# Patient Record
Sex: Female | Born: 1985 | Race: Black or African American | Hispanic: No | Marital: Single | Smoking: Current every day smoker
Health system: Southern US, Community
[De-identification: ages and names within clinical notes are randomized; demographics above are authoritative.]

## PROBLEM LIST (undated history)

## (undated) DIAGNOSIS — Z8659 Personal history of other mental and behavioral disorders: Secondary | ICD-10-CM

## (undated) DIAGNOSIS — Z87898 Personal history of other specified conditions: Secondary | ICD-10-CM

## (undated) DIAGNOSIS — R569 Unspecified convulsions: Secondary | ICD-10-CM

## (undated) HISTORY — DX: Personal history of other specified conditions: Z87.898

## (undated) HISTORY — DX: Personal history of other mental and behavioral disorders: Z86.59

---

## 2004-07-04 ENCOUNTER — Emergency Department: Payer: Self-pay | Admitting: Emergency Medicine

## 2005-07-25 ENCOUNTER — Emergency Department: Payer: Self-pay | Admitting: General Practice

## 2005-07-25 ENCOUNTER — Other Ambulatory Visit: Payer: Self-pay

## 2005-10-09 ENCOUNTER — Emergency Department: Payer: Self-pay | Admitting: Emergency Medicine

## 2007-02-22 ENCOUNTER — Emergency Department: Payer: Self-pay | Admitting: Emergency Medicine

## 2007-05-07 ENCOUNTER — Emergency Department: Payer: Self-pay | Admitting: Emergency Medicine

## 2007-06-01 ENCOUNTER — Emergency Department: Payer: Self-pay | Admitting: Emergency Medicine

## 2007-07-21 ENCOUNTER — Encounter: Payer: Self-pay | Admitting: Maternal & Fetal Medicine

## 2007-10-04 ENCOUNTER — Observation Stay: Payer: Self-pay

## 2007-12-19 ENCOUNTER — Observation Stay: Payer: Self-pay | Admitting: Obstetrics & Gynecology

## 2007-12-22 ENCOUNTER — Inpatient Hospital Stay: Payer: Self-pay

## 2008-03-09 ENCOUNTER — Emergency Department: Payer: Self-pay | Admitting: Emergency Medicine

## 2008-08-01 ENCOUNTER — Emergency Department: Payer: Self-pay | Admitting: Internal Medicine

## 2009-01-01 ENCOUNTER — Emergency Department: Payer: Self-pay | Admitting: Unknown Physician Specialty

## 2009-06-03 ENCOUNTER — Emergency Department: Payer: Self-pay | Admitting: Internal Medicine

## 2010-07-03 ENCOUNTER — Emergency Department: Payer: Self-pay | Admitting: Internal Medicine

## 2010-07-07 ENCOUNTER — Emergency Department: Payer: Self-pay | Admitting: Emergency Medicine

## 2010-11-03 ENCOUNTER — Emergency Department: Payer: Self-pay | Admitting: Unknown Physician Specialty

## 2011-02-17 ENCOUNTER — Emergency Department: Payer: Self-pay | Admitting: *Deleted

## 2011-06-17 ENCOUNTER — Emergency Department: Payer: Self-pay | Admitting: *Deleted

## 2011-08-09 ENCOUNTER — Emergency Department: Payer: Self-pay | Admitting: *Deleted

## 2011-08-09 LAB — TROPONIN I: Troponin-I: <0.02 ng/mL

## 2011-08-09 LAB — COMPREHENSIVE METABOLIC PANEL
Anion Gap: 11 (ref 7–16)
BUN: 8 mg/dL (ref 7–18)
Calcium, Total: 9.2 mg/dL (ref 8.5–10.1)
Chloride: 103 mmol/L (ref 98–107)
Co2: 27 mmol/L (ref 21–32)
Creatinine: 0.89 mg/dL (ref 0.60–1.30)
EGFR (African American): >60
Glucose: 86 mg/dL (ref 65–99)
Osmolality: 279 (ref 275–301)
Potassium: 3.8 mmol/L (ref 3.5–5.1)
SGOT(AST): 21 U/L (ref 15–37)
Sodium: 141 mmol/L (ref 136–145)

## 2011-08-09 LAB — CBC
HGB: 13.3 g/dL (ref 12.0–16.0)
MCHC: 32.7 g/dL (ref 32.0–36.0)
Platelet: 323 10*3/uL (ref 150–440)
RBC: 4.26 10*6/uL (ref 3.80–5.20)
RDW: 13.2 % (ref 11.5–14.5)

## 2011-08-11 ENCOUNTER — Emergency Department: Payer: Self-pay | Admitting: *Deleted

## 2011-08-11 LAB — COMPREHENSIVE METABOLIC PANEL
Alkaline Phosphatase: 25 U/L — ABNORMAL LOW (ref 50–136)
Anion Gap: 10 (ref 7–16)
BUN: 13 mg/dL (ref 7–18)
Bilirubin,Total: 0.5 mg/dL (ref 0.2–1.0)
Calcium, Total: 8.8 mg/dL (ref 8.5–10.1)
Chloride: 103 mmol/L (ref 98–107)
EGFR (Non-African Amer.): >60
Glucose: 92 mg/dL (ref 65–99)
Osmolality: 279 (ref 275–301)
Potassium: 4 mmol/L (ref 3.5–5.1)
SGOT(AST): 18 U/L (ref 15–37)

## 2011-08-11 LAB — CBC
HCT: 40.5 % (ref 35.0–47.0)
HGB: 13.1 g/dL (ref 12.0–16.0)
MCH: 30.7 pg (ref 26.0–34.0)
MCHC: 32.3 g/dL (ref 32.0–36.0)

## 2011-08-11 LAB — URINALYSIS, COMPLETE
Bacteria: NONE SEEN
Bilirubin,UR: NEGATIVE
Glucose,UR: NEGATIVE mg/dL (ref 0–75)
Ketone: NEGATIVE
Leukocyte Esterase: NEGATIVE
Nitrite: NEGATIVE
Protein: NEGATIVE
RBC,UR: 1 /HPF (ref 0–5)
Squamous Epithelial: 1
WBC UR: 1 /HPF (ref 0–5)

## 2011-09-12 ENCOUNTER — Emergency Department: Payer: Self-pay | Admitting: Emergency Medicine

## 2011-09-12 LAB — CBC
HCT: 41.3 % (ref 35.0–47.0)
HGB: 13.1 g/dL (ref 12.0–16.0)
MCH: 30 pg (ref 26.0–34.0)
MCHC: 31.7 g/dL — ABNORMAL LOW (ref 32.0–36.0)
MCV: 95 fL (ref 80–100)
Platelet: 326 10*3/uL (ref 150–440)
RBC: 4.37 10*6/uL (ref 3.80–5.20)
WBC: 18.3 10*3/uL — ABNORMAL HIGH (ref 3.6–11.0)

## 2011-09-12 LAB — DRUG SCREEN, URINE
Amphetamines, Ur Screen: NEGATIVE (ref ?–1000)
Barbiturates, Ur Screen: NEGATIVE (ref ?–200)
Benzodiazepine, Ur Scrn: NEGATIVE (ref ?–200)
Cannabinoid 50 Ng, Ur ~~LOC~~: NEGATIVE (ref ?–50)
Cocaine Metabolite,Ur ~~LOC~~: POSITIVE (ref ?–300)
MDMA (Ecstasy)Ur Screen: POSITIVE (ref ?–500)
Methadone, Ur Screen: NEGATIVE (ref ?–300)
Phencyclidine (PCP) Ur S: NEGATIVE (ref ?–25)
Tricyclic, Ur Screen: NEGATIVE (ref ?–1000)

## 2011-09-12 LAB — COMPREHENSIVE METABOLIC PANEL
Anion Gap: 17 — ABNORMAL HIGH (ref 7–16)
Bilirubin,Total: 0.7 mg/dL (ref 0.2–1.0)
Calcium, Total: 9.2 mg/dL (ref 8.5–10.1)
Chloride: 103 mmol/L (ref 98–107)
Creatinine: 0.84 mg/dL (ref 0.60–1.30)
EGFR (African American): >60
EGFR (Non-African Amer.): >60
Osmolality: 276 (ref 275–301)
SGOT(AST): 40 U/L — ABNORMAL HIGH (ref 15–37)
SGPT (ALT): 29 U/L
Sodium: 140 mmol/L (ref 136–145)
Total Protein: 8.4 g/dL — ABNORMAL HIGH (ref 6.4–8.2)

## 2011-09-12 LAB — URINALYSIS, COMPLETE
Bilirubin,UR: NEGATIVE
Blood: NEGATIVE
Glucose,UR: 50 mg/dL (ref 0–75)
Leukocyte Esterase: NEGATIVE
Nitrite: NEGATIVE
Ph: 5 (ref 4.5–8.0)
Protein: NEGATIVE
RBC,UR: <1 /HPF (ref 0–5)
Squamous Epithelial: <1
WBC UR: <1 /HPF (ref 0–5)

## 2011-09-12 LAB — ETHANOL: Ethanol: 97 mg/dL

## 2011-09-12 LAB — PREGNANCY, URINE: Pregnancy Test, Urine: NEGATIVE m[IU]/mL

## 2011-10-11 ENCOUNTER — Emergency Department: Payer: Self-pay | Admitting: Internal Medicine

## 2012-01-16 ENCOUNTER — Emergency Department: Payer: Self-pay | Admitting: *Deleted

## 2012-04-11 IMAGING — CR DG THORACIC SPINE 2-3V
1 series · 3 of 3 positions shown · non-contrast
Comparison: none

REASON FOR EXAM: back pain
COMMENTS:

PROCEDURE:     DXR - DXR THORACIC  AP AND LATERAL  - July 03, 2010 [DATE]
RESULT:     The vertebral body heights and the intervertebral disc spaces
are well maintained. The vertebral body alignment is normal. No lytic or
blastic lesions are seen. The pedicles are bilaterally intact.

[Series 1: view not recorded · 0.17mm/px · 3 of 3 slices shown]
[im 1/3]
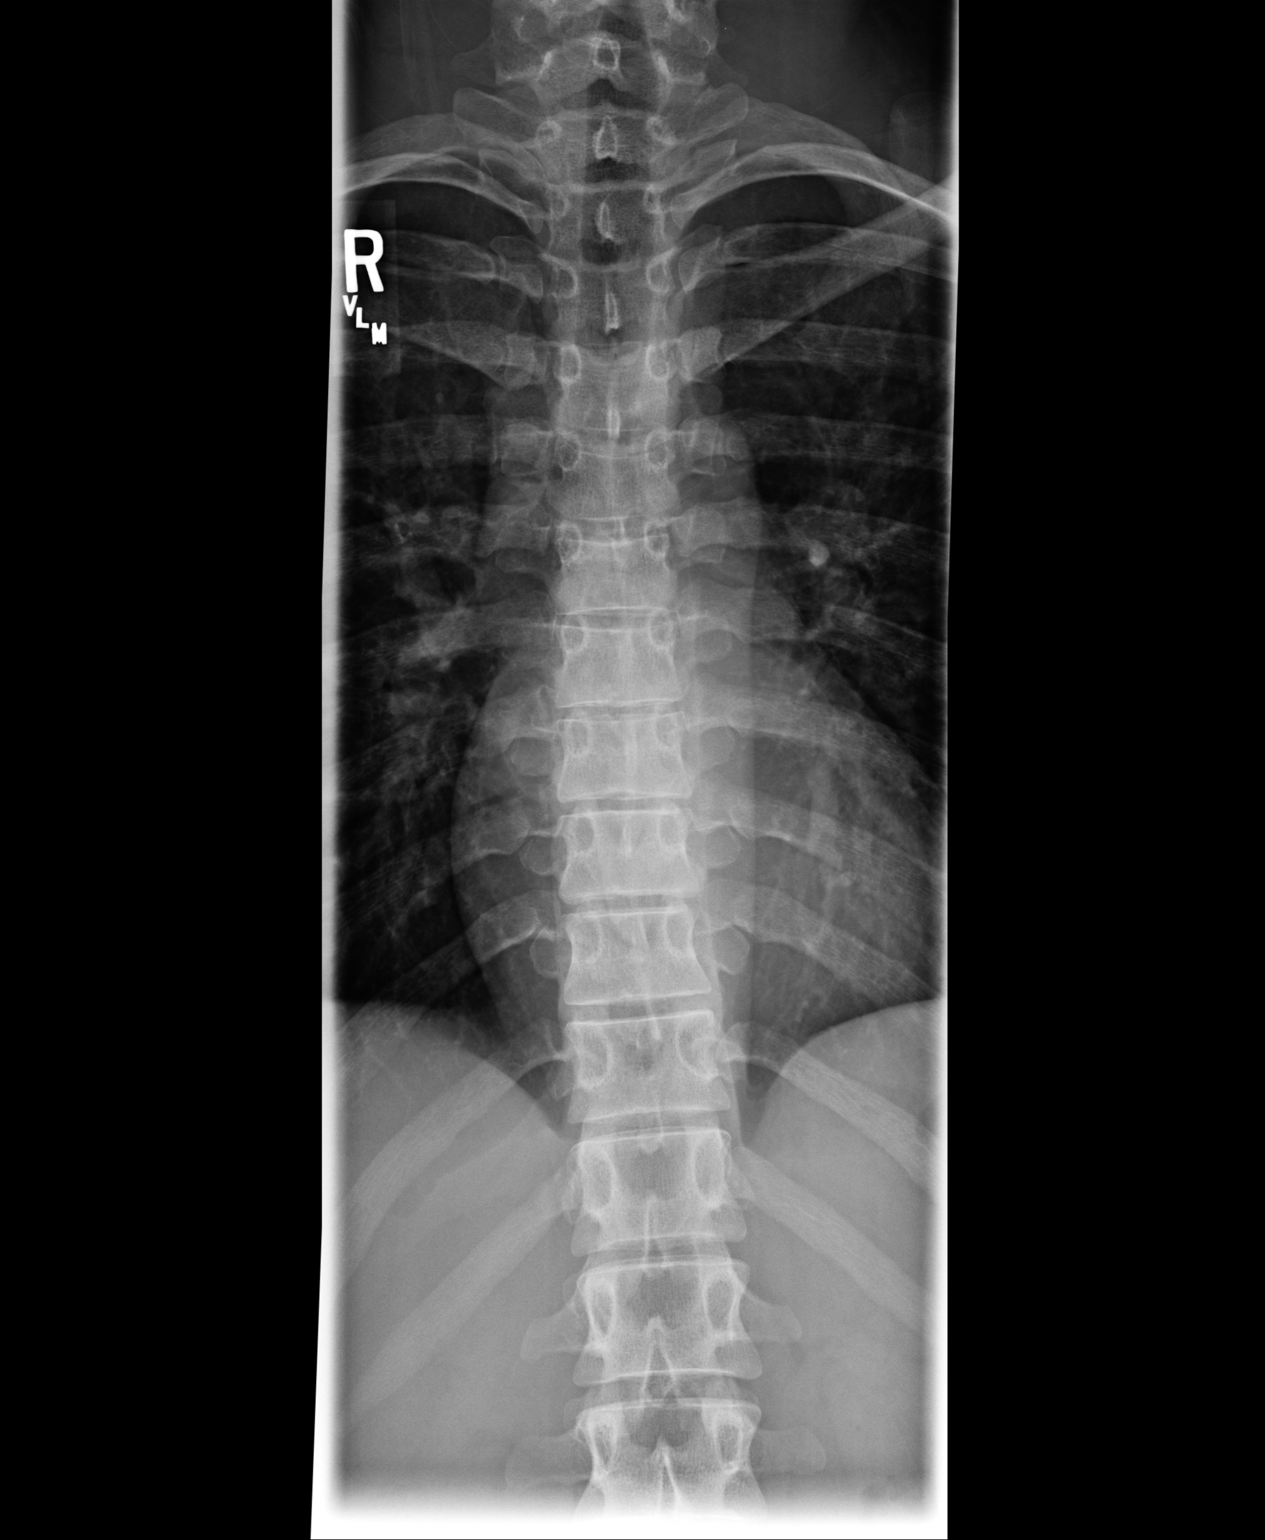
[im 2/3]
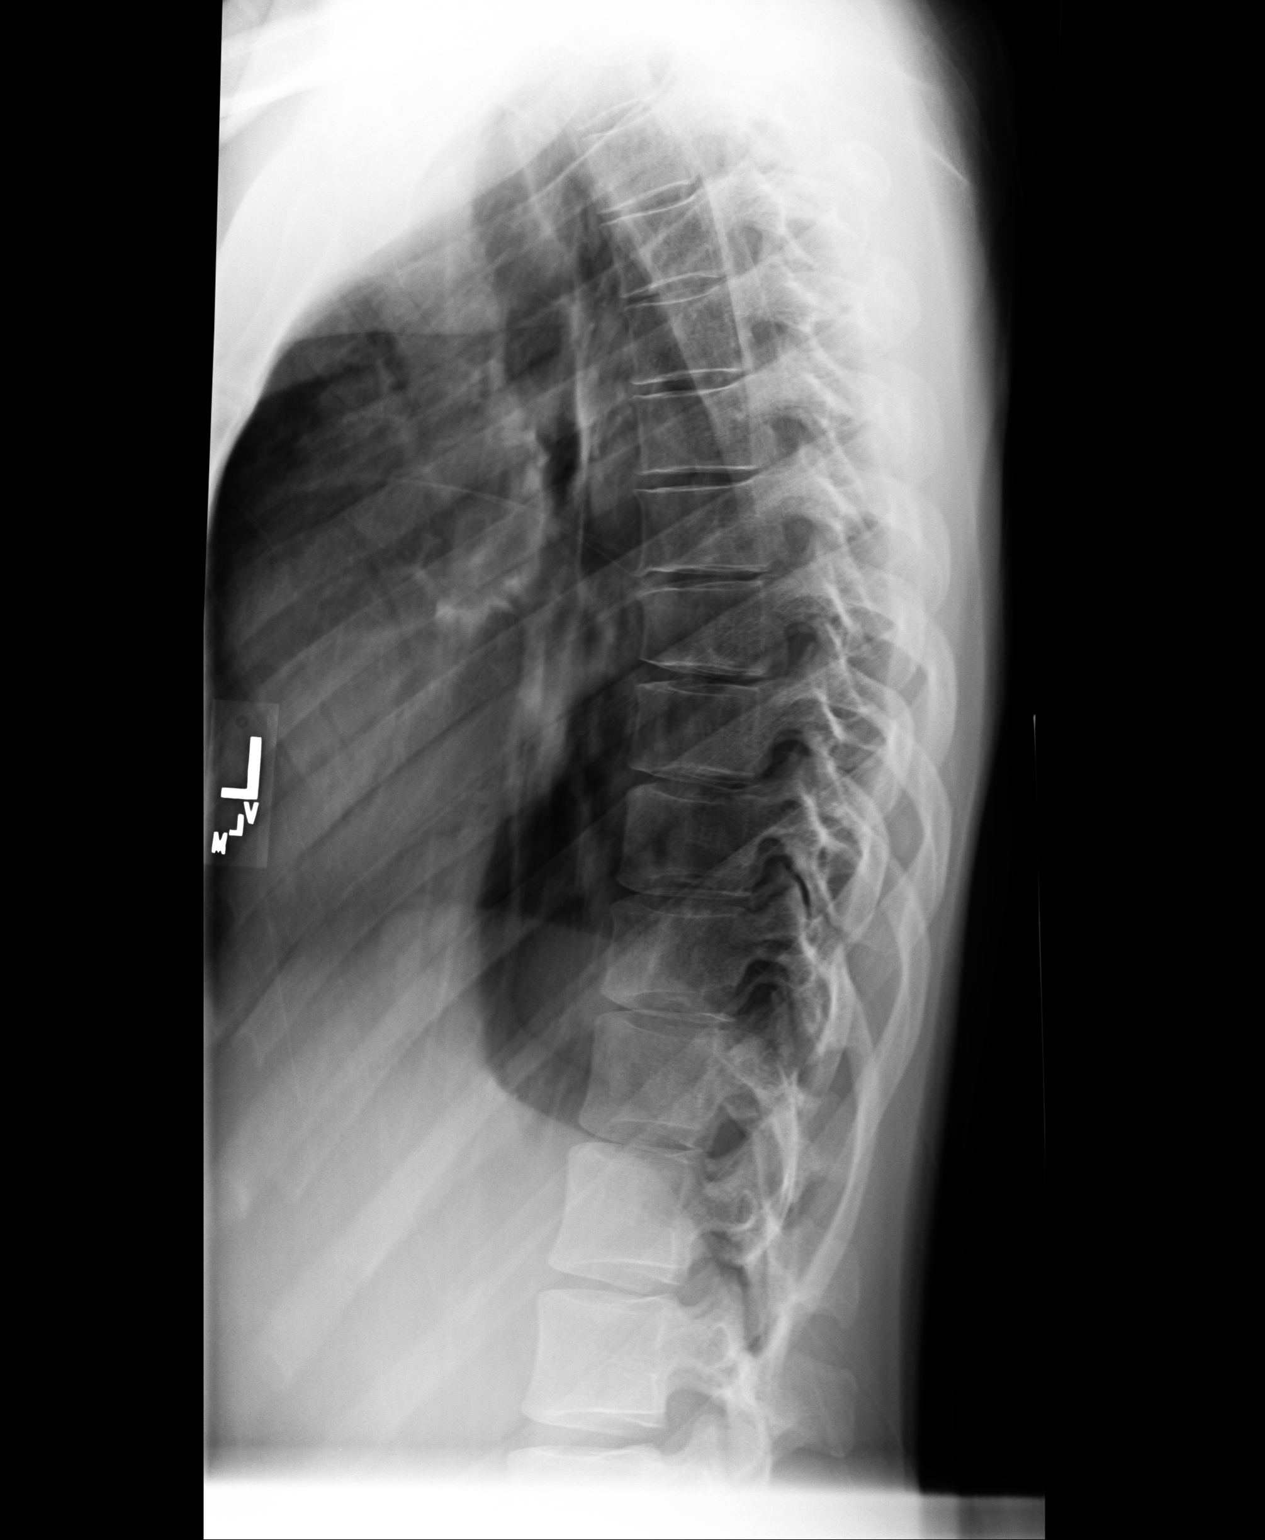
[im 3/3]
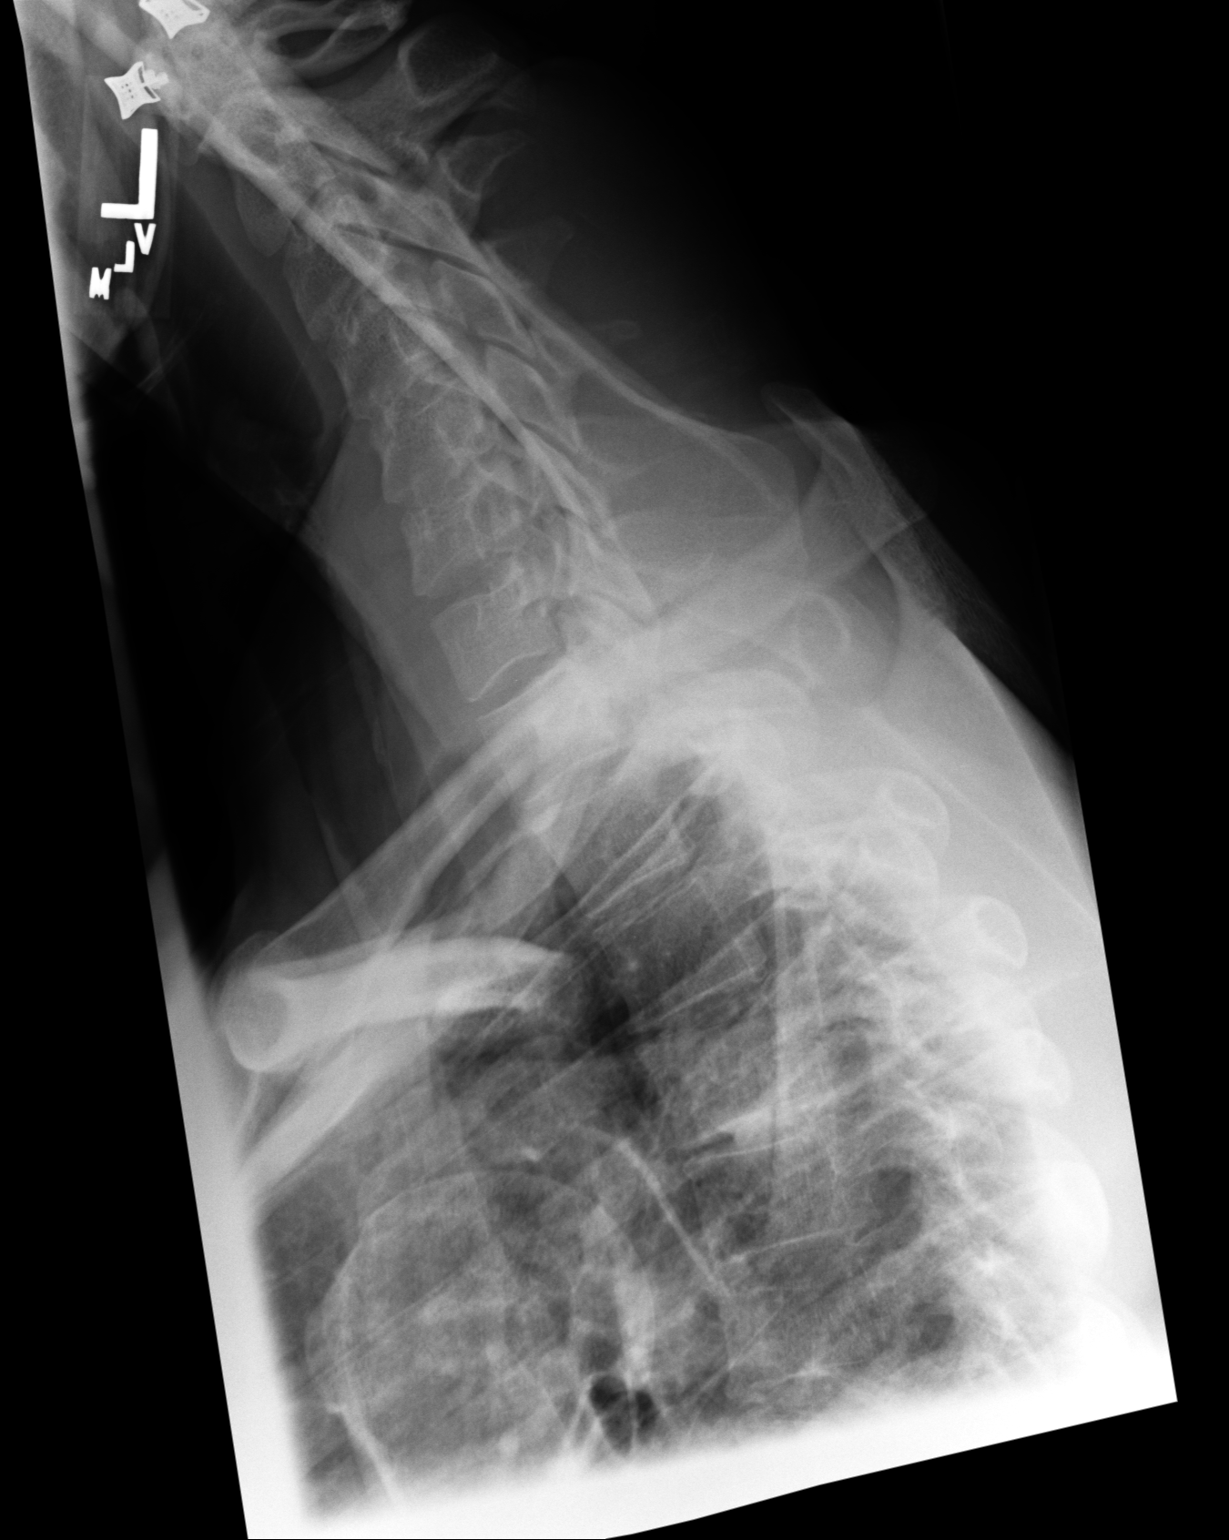

[3 of 3 positions shown; findings below may reference images not displayed]

IMPRESSION: 1.     No significant abnormalities are noted.

## 2012-07-18 ENCOUNTER — Emergency Department: Payer: Self-pay | Admitting: Emergency Medicine

## 2012-08-31 ENCOUNTER — Emergency Department: Payer: Self-pay | Admitting: Emergency Medicine

## 2012-09-15 ENCOUNTER — Emergency Department: Payer: Self-pay | Admitting: Emergency Medicine

## 2013-04-10 ENCOUNTER — Emergency Department: Payer: Self-pay | Admitting: Emergency Medicine

## 2013-04-15 ENCOUNTER — Emergency Department: Payer: Self-pay | Admitting: Emergency Medicine

## 2013-07-08 ENCOUNTER — Emergency Department: Payer: Self-pay | Admitting: Emergency Medicine

## 2013-07-08 LAB — CBC
HCT: 37.2 % (ref 35.0–47.0)
HGB: 12.4 g/dL (ref 12.0–16.0)
MCH: 31.1 pg (ref 26.0–34.0)
MCHC: 33.3 g/dL (ref 32.0–36.0)
MCV: 93 fL (ref 80–100)
Platelet: 259 10*3/uL (ref 150–440)
RBC: 3.99 10*6/uL (ref 3.80–5.20)
RDW: 13.5 % (ref 11.5–14.5)
WBC: 7.5 10*3/uL (ref 3.6–11.0)

## 2013-07-08 LAB — BASIC METABOLIC PANEL
Anion Gap: 4 — ABNORMAL LOW (ref 7–16)
BUN: 12 mg/dL (ref 7–18)
CHLORIDE: 107 mmol/L (ref 98–107)
Calcium, Total: 8.8 mg/dL (ref 8.5–10.1)
Co2: 26 mmol/L (ref 21–32)
Creatinine: 0.66 mg/dL (ref 0.60–1.30)
EGFR (African American): >60
EGFR (Non-African Amer.): >60
Glucose: 84 mg/dL (ref 65–99)
Osmolality: 273 (ref 275–301)
POTASSIUM: 4.1 mmol/L (ref 3.5–5.1)
Sodium: 137 mmol/L (ref 136–145)

## 2014-03-10 ENCOUNTER — Emergency Department: Payer: Self-pay | Admitting: Emergency Medicine

## 2014-06-22 ENCOUNTER — Emergency Department: Payer: Self-pay | Admitting: Emergency Medicine

## 2014-08-09 ENCOUNTER — Emergency Department: Payer: Self-pay | Admitting: Emergency Medicine

## 2014-12-27 ENCOUNTER — Encounter: Payer: Self-pay | Admitting: Emergency Medicine

## 2014-12-27 DIAGNOSIS — Z3202 Encounter for pregnancy test, result negative: Secondary | ICD-10-CM | POA: Insufficient documentation

## 2014-12-27 DIAGNOSIS — D252 Subserosal leiomyoma of uterus: Secondary | ICD-10-CM | POA: Insufficient documentation

## 2014-12-27 LAB — POCT PREGNANCY, URINE: Preg Test, Ur: NEGATIVE

## 2014-12-27 NOTE — ED Notes (Signed)
Pt presents to ER alert and in NAD. Pt states RLQ pain for one month. Pt denies n/v/d. Pt laughing and joking with friend.

## 2014-12-28 ENCOUNTER — Emergency Department
Admission: EM | Admit: 2014-12-28 | Discharge: 2014-12-28 | Disposition: A | Discharge status: Home / Self Care | Payer: Self-pay | Attending: Emergency Medicine | Admitting: Emergency Medicine

## 2014-12-28 ENCOUNTER — Emergency Department: Payer: Self-pay

## 2014-12-28 DIAGNOSIS — R102 Pelvic and perineal pain unspecified side: Secondary | ICD-10-CM

## 2014-12-28 DIAGNOSIS — D252 Subserosal leiomyoma of uterus: Secondary | ICD-10-CM

## 2014-12-28 LAB — COMPREHENSIVE METABOLIC PANEL
ALK PHOS: 32 U/L — AB (ref 38–126)
ALT: 12 U/L — ABNORMAL LOW (ref 14–54)
AST: 19 U/L (ref 15–41)
Albumin: 4.1 g/dL (ref 3.5–5.0)
Anion gap: 6 (ref 5–15)
BUN: 15 mg/dL (ref 6–20)
CALCIUM: 9 mg/dL (ref 8.9–10.3)
CHLORIDE: 106 mmol/L (ref 101–111)
CO2: 27 mmol/L (ref 22–32)
Creatinine, Ser: 0.71 mg/dL (ref 0.44–1.00)
GFR calc Af Amer: >60 mL/min (ref >60)
GFR calc non Af Amer: >60 mL/min (ref >60)
GLUCOSE: 92 mg/dL (ref 65–99)
POTASSIUM: 3.8 mmol/L (ref 3.5–5.1)
SODIUM: 139 mmol/L (ref 135–145)
TOTAL PROTEIN: 6.9 g/dL (ref 6.5–8.1)
Total Bilirubin: 0.4 mg/dL (ref 0.3–1.2)

## 2014-12-28 LAB — CBC WITH DIFFERENTIAL/PLATELET
BASOS ABS: 0.1 10*3/uL (ref 0–0.1)
BASOS PCT: 1 %
Eosinophils Absolute: 0.3 10*3/uL (ref 0–0.7)
Eosinophils Relative: 3 %
HEMATOCRIT: 36.6 % (ref 35.0–47.0)
Hemoglobin: 12.2 g/dL (ref 12.0–16.0)
LYMPHS PCT: 41 %
Lymphs Abs: 3.6 10*3/uL (ref 1.0–3.6)
MCH: 30.3 pg (ref 26.0–34.0)
MCHC: 33.2 g/dL (ref 32.0–36.0)
MCV: 91.4 fL (ref 80.0–100.0)
Monocytes Absolute: 0.6 10*3/uL (ref 0.2–0.9)
Monocytes Relative: 7 %
NEUTROS PCT: 48 %
Neutro Abs: 4.3 10*3/uL (ref 1.4–6.5)
PLATELETS: 267 10*3/uL (ref 150–440)
RBC: 4.01 MIL/uL (ref 3.80–5.20)
RDW: 14.7 % — ABNORMAL HIGH (ref 11.5–14.5)
WBC: 8.8 10*3/uL (ref 3.6–11.0)

## 2014-12-28 LAB — URINALYSIS COMPLETE WITH MICROSCOPIC (ARMC ONLY)
BACTERIA UA: NONE SEEN
BILIRUBIN URINE: NEGATIVE
Glucose, UA: NEGATIVE mg/dL
Hgb urine dipstick: NEGATIVE
KETONES UR: NEGATIVE mg/dL
Leukocytes, UA: NEGATIVE
Nitrite: NEGATIVE
PH: 7 (ref 5.0–8.0)
PROTEIN: NEGATIVE mg/dL
SPECIFIC GRAVITY, URINE: 1.021 (ref 1.005–1.030)

## 2014-12-28 LAB — LIPASE, BLOOD: Lipase: 43 U/L (ref 22–51)

## 2014-12-28 MED ORDER — KETOROLAC TROMETHAMINE 10 MG PO TABS: 10.0000 mg | ORAL_TABLET | Freq: Three times a day (TID) | ORAL | Status: DC | PRN

## 2014-12-28 MED ORDER — OXYCODONE-ACETAMINOPHEN 5-325 MG PO TABS
1.0000 | ORAL_TABLET | Freq: Once | ORAL | Status: AC
  Administered 2014-12-28: 1 via ORAL

## 2014-12-28 MED ORDER — KETOROLAC TROMETHAMINE 10 MG PO TABS: 10.0000 mg | ORAL_TABLET | Freq: Once | ORAL | Status: DC

## 2014-12-28 MED ORDER — OXYCODONE-ACETAMINOPHEN 5-325 MG PO TABS
ORAL_TABLET | ORAL | Status: AC
  Administered 2014-12-28: 1 via ORAL
  Filled 2014-12-28: qty 1

## 2014-12-28 NOTE — ED Notes (Signed)
Patient states abdominal pain for last month. Comes and goes. Came in today because pain was bad today.

## 2014-12-28 NOTE — ED Notes (Signed)
Patient with no complaints at this time. Respirations even and unlabored. Skin warm/dry. Discharge instructions reviewed with patient at this time. Patient given opportunity to voice concerns/ask questions. Patient discharged at this time and left Emergency Department with steady gait.   

## 2014-12-28 NOTE — ED Provider Notes (Signed)
[facility] Emergency Department Provider Note  ____________________________________________  Time seen: 1:45 AM  I have reviewed the triage vital signs and the nursing notes.   HISTORY  Chief Complaint Abdominal Pain      HPI Andrea Stephens is a 29 y.o. female resents with pelvic pain times one month. Patient states current pain score is 9 out of 10 and suprapubic. She denies any dysuria no vaginal discharge and no fever patient denies any possibility of pregnancy as she is homosexual and monogamous.    Past medical history None There are no active problems to display for this patient.   Past Surgical history None No current outpatient prescriptions on file.  Allergies No known drug allergies History reviewed. No pertinent family history.  Social History History  Substance Use Topics  . Smoking status: Never Smoker   . Smokeless tobacco: Not on file  . Alcohol Use: No    Review of Systems  Constitutional: Negative for fever. Eyes: Negative for visual changes. ENT: Negative for sore throat. Cardiovascular: Negative for chest pain. Respiratory: Negative for shortness of breath. Gastrointestinal: Negative for abdominal pain, vomiting and diarrhea. Genitourinary: Negative for dysuria. Musculoskeletal: Negative for back pain. Skin: Negative for rash. Neurological: Negative for headaches, focal weakness or numbness.   10-point ROS otherwise negative.  ____________________________________________   PHYSICAL EXAM:  VITAL SIGNS: ED Triage Vitals  Enc Vitals Group     BP 12/27/14 2340 107/74 mmHg     Pulse Rate 12/27/14 2340 64     Resp 12/27/14 2340 18     Temp 12/27/14 2340 97.6 F (36.4 C)     Temp Source 12/27/14 2340 Oral     SpO2 12/27/14 2340 100 %     Weight 12/27/14 2340 150 lb (68.04 kg)     Height 12/27/14 2340 6\' 1"  (1.854 m)     Head Cir --      Peak Flow --      Pain Score 12/27/14 2341 6     Pain Loc --       Pain Edu? --      Excl. in [place]? --     Constitutional: Alert and oriented. Well appearing and in no distress. Eyes: Conjunctivae are normal. PERRL. Normal extraocular movements. ENT   Head: Normocephalic and atraumatic.   Nose: No congestion/rhinnorhea.   Mouth/Throat: Mucous membranes are moist.   Neck: No stridor. Cardiovascular: Normal rate, regular rhythm. Normal and symmetric distal pulses are present in all extremities. No murmurs, rubs, or gallops. Respiratory: Normal respiratory effort without tachypnea nor retractions. Breath sounds are clear and equal bilaterally. No wheezes/rales/rhonchi. Gastrointestinal: Soft and nontender. No distention. There is no CVA tenderness. In the palpation suprapubic region. Genitourinary: deferred Musculoskeletal: Nontender with normal range of motion in all extremities. No joint effusions.  No lower extremity tenderness nor edema. Neurologic:  Normal speech and language. No gross focal neurologic deficits are appreciated. Speech is normal.  Skin:  Skin is warm, dry and intact. No rash noted. Psychiatric: Mood and affect are normal. Speech and behavior are normal. Patient exhibits appropriate insight and judgment.  ____________________________________________    LABS (pertinent positives/negatives)  Labs Reviewed  CBC WITH DIFFERENTIAL/PLATELET - Abnormal; Notable for the following:    RDW 14.7 (*)    All other components within normal limits  COMPREHENSIVE METABOLIC PANEL - Abnormal; Notable for the following:    ALT 12 (*)    Alkaline Phosphatase 32 (*)    All other components within  normal limits  URINALYSIS COMPLETEWITH MICROSCOPIC ([facility] ONLY) - Abnormal; Notable for the following:    Color, Urine YELLOW (*)    APPearance CLEAR (*)    Squamous Epithelial / LPF 0-5 (*)    All other components within normal limits  LIPASE, BLOOD  URINE DRUG SCREEN, QUALITATIVE ([facility] ONLY)  POC URINE PREG, ED  POCT PREGNANCY, URINE       RADIOLOGY  Ultrasound pelvis revealed:    US Transvaginal Non-OB (Final result) Result time: 12/28/14 02:41:20   Final result by Rad Results In Interface (12/28/14 02:41:20)   Narrative:   CLINICAL DATA: Pelvic pain for 2 days. G1 P1.  EXAM: TRANSABDOMINAL AND TRANSVAGINAL ULTRASOUND OF PELVIS  TECHNIQUE: Both transabdominal and transvaginal ultrasound examinations of the pelvis were performed. Transabdominal technique was performed for global imaging of the pelvis including uterus, ovaries, adnexal regions, and pelvic cul-de-sac. It was necessary to proceed with endovaginal exam following the transabdominal exam to visualize the ovaries and endometrium.  COMPARISON: Obstetric ultrasound July 21, 2007  FINDINGS: Uterus  Measurements: 7.1 x 4.7 x 4.3 cm. Hypoechoic 11 x 10 x 12 mm sub serosal anterior uterine wall leiomyoma.  Endometrium  Thickness: 3 mm. No focal abnormality visualized.  Right ovary  Measurements: 2.9 x 2.1 x 2.5 cm. Normal appearance/no adnexal mass.  Left ovary  Measurements: 2.7 x 2.1 x 2.4 cm. Normal appearance/no adnexal mass.  Other findings  No free fluid.  IMPRESSION: Sub serosal 11 x 10 x 12 mm anterior uterine wall leiomyoma.  No acute pelvic process.   Electronically Signed By: Elon Alas M.D. On: 12/28/2014 02:41            INITIAL IMPRESSION / ASSESSMENT AND PLAN / ED COURSE  Pertinent labs & imaging results that were available during my care of the patient were reviewed by me and considered in my medical decision making (see chart for details).  She physical exam/ultrasound revealed leiomyoma as such patient will be referred to Dr. Dara Lords for outpatient OB/GYN evaluation.  ____________________________________________   FINAL CLINICAL IMPRESSION(S) / ED DIAGNOSES  Final diagnoses:  Subserous leiomyoma of uterus      Gregor Hams, MD 12/28/14 [phone]

## 2014-12-28 NOTE — Discharge Instructions (Signed)
Uterine Fibroid A uterine fibroid is a growth (tumor) that occurs in your uterus. This type of tumor is not cancerous and does not spread out of the uterus. You can have one or many fibroids. Fibroids can vary in size, weight, and where they grow in the uterus. Some can become quite large. Most fibroids do not require medical treatment, but some can cause pain or heavy bleeding during and between periods. CAUSES  A fibroid is the result of a single uterine cell that keeps growing (unregulated), which is different than most cells in the human body. Most cells have a control mechanism that keeps them from reproducing without control.  SIGNS AND SYMPTOMS   Bleeding.  Pelvic pain and pressure.  Bladder problems due to the size of the fibroid.  Infertility and miscarriages depending on the size and location of the fibroid. DIAGNOSIS  Uterine fibroids are diagnosed through a physical exam. Your health care provider may feel the lumpy tumors during a pelvic exam. Ultrasonography may be done to get information regarding size, location, and number of tumors.  TREATMENT   Your health care provider may recommend watchful waiting. This involves getting the fibroid checked by your health care provider to see if it grows or shrinks.   Hormone treatment or an intrauterine device (IUD) may be prescribed.   Surgery may be needed to remove the fibroids (myomectomy) or the uterus (hysterectomy). This depends on your situation. When fibroids interfere with fertility and a woman wants to become pregnant, a health care provider may recommend having the fibroids removed.  HOME CARE INSTRUCTIONS  Home care depends on how you were treated. In general:   Keep all follow-up appointments with your health care provider.   Only take over-the-counter or prescription medicines as directed by your health care provider. If you were prescribed a hormone treatment, take the hormone medicines exactly as directed. Do not  take aspirin. It can cause bleeding.   Talk to your health care provider about taking iron pills.  If your periods are troublesome but not so heavy, lie down with your feet raised slightly above your heart. Place cold packs on your lower abdomen.   If your periods are heavy, write down the number of pads or tampons you use per month. Bring this information to your health care provider.   Include green vegetables in your diet.  SEEK IMMEDIATE MEDICAL CARE IF:  You have pelvic pain or cramps not controlled with medicines.   You have a sudden increase in pelvic pain.   You have an increase in bleeding between and during periods.   You have excessive periods and soak tampons or pads in a half hour or less.  You feel lightheaded or have fainting episodes. Document Released: 05/15/2000 Document Revised: 03/08/2013 Document Reviewed: 12/15/2012 [employer] Patient Information 2015 [employer]. This information is not intended to replace advice given to you by your health care provider. Make sure you discuss any questions you have with your health care provider.  

## 2014-12-28 NOTE — ED Notes (Signed)
Patient to US

## 2015-01-16 ENCOUNTER — Encounter: Payer: Self-pay | Admitting: Emergency Medicine

## 2015-01-16 ENCOUNTER — Emergency Department
Admission: EM | Admit: 2015-01-16 | Discharge: 2015-01-16 | Disposition: A | Discharge status: Home / Self Care | Payer: Self-pay | Attending: Emergency Medicine | Admitting: Emergency Medicine

## 2015-01-16 DIAGNOSIS — D259 Leiomyoma of uterus, unspecified: Secondary | ICD-10-CM | POA: Insufficient documentation

## 2015-01-16 DIAGNOSIS — D219 Benign neoplasm of connective and other soft tissue, unspecified: Secondary | ICD-10-CM

## 2015-01-16 DIAGNOSIS — Z3202 Encounter for pregnancy test, result negative: Secondary | ICD-10-CM | POA: Insufficient documentation

## 2015-01-16 LAB — COMPREHENSIVE METABOLIC PANEL
ALBUMIN: 4.2 g/dL (ref 3.5–5.0)
ALT: 13 U/L — AB (ref 14–54)
AST: 23 U/L (ref 15–41)
Alkaline Phosphatase: 29 U/L — ABNORMAL LOW (ref 38–126)
Anion gap: 7 (ref 5–15)
BUN: 15 mg/dL (ref 6–20)
CO2: 24 mmol/L (ref 22–32)
CREATININE: 1.09 mg/dL — AB (ref 0.44–1.00)
Calcium: 8.8 mg/dL — ABNORMAL LOW (ref 8.9–10.3)
Chloride: 103 mmol/L (ref 101–111)
GFR calc Af Amer: >60 mL/min (ref >60)
GFR calc non Af Amer: >60 mL/min (ref >60)
GLUCOSE: 116 mg/dL — AB (ref 65–99)
POTASSIUM: 3.7 mmol/L (ref 3.5–5.1)
SODIUM: 134 mmol/L — AB (ref 135–145)
Total Bilirubin: 0.7 mg/dL (ref 0.3–1.2)
Total Protein: 7.1 g/dL (ref 6.5–8.1)

## 2015-01-16 LAB — LIPASE, BLOOD: LIPASE: 26 U/L (ref 22–51)

## 2015-01-16 LAB — URINALYSIS COMPLETE WITH MICROSCOPIC (ARMC ONLY)
BILIRUBIN URINE: NEGATIVE
Bacteria, UA: NONE SEEN
Glucose, UA: NEGATIVE mg/dL
Hgb urine dipstick: NEGATIVE
KETONES UR: NEGATIVE mg/dL
Leukocytes, UA: NEGATIVE
Nitrite: NEGATIVE
PH: 7 (ref 5.0–8.0)
PROTEIN: NEGATIVE mg/dL
Specific Gravity, Urine: 1.023 (ref 1.005–1.030)

## 2015-01-16 LAB — CBC WITH DIFFERENTIAL/PLATELET
Basophils Absolute: 0.1 10*3/uL (ref 0–0.1)
Basophils Relative: 1 %
EOS ABS: 0.2 10*3/uL (ref 0–0.7)
EOS PCT: 3 %
HCT: 39.6 % (ref 35.0–47.0)
Hemoglobin: 12.8 g/dL (ref 12.0–16.0)
Lymphocytes Relative: 36 %
Lymphs Abs: 3.1 10*3/uL (ref 1.0–3.6)
MCH: 29.2 pg (ref 26.0–34.0)
MCHC: 32.2 g/dL (ref 32.0–36.0)
MCV: 90.9 fL (ref 80.0–100.0)
MONOS PCT: 8 %
Monocytes Absolute: 0.6 10*3/uL (ref 0.2–0.9)
Neutro Abs: 4.5 10*3/uL (ref 1.4–6.5)
Neutrophils Relative %: 52 %
PLATELETS: 318 10*3/uL (ref 150–440)
RBC: 4.36 MIL/uL (ref 3.80–5.20)
RDW: 15 % — ABNORMAL HIGH (ref 11.5–14.5)
WBC: 8.5 10*3/uL (ref 3.6–11.0)

## 2015-01-16 LAB — POCT PREGNANCY, URINE: Preg Test, Ur: NEGATIVE

## 2015-01-16 MED ORDER — ACETAMINOPHEN 500 MG PO TABS
1000.0000 mg | ORAL_TABLET | ORAL | Status: AC
  Administered 2015-01-16: 1000 mg via ORAL
  Filled 2015-01-16: qty 2

## 2015-01-16 MED ORDER — IBUPROFEN 600 MG PO TABS
600.0000 mg | ORAL_TABLET | ORAL | Status: AC
  Administered 2015-01-16: 600 mg via ORAL
  Filled 2015-01-16: qty 1

## 2015-01-16 NOTE — ED Notes (Signed)
AAOx3.  Skin warm and dry. NAD.  Ambulates with easy and steady gait.  Moving all extremities.

## 2015-01-16 NOTE — ED Notes (Signed)
LLQ pain x3-4 months , was seen here 3 weeks ago, with RX of tramadol , out of tramadol , unable to get follow up. Denies vaginal discharge , no urinary symptoms , Last BM x1 day ( normal) ,

## 2015-01-16 NOTE — Discharge Instructions (Signed)
You have a leiomyoma. Please follow-up with OBGYN as soon as possible.  No driving or working in dangerous areas while taking tramadol. Never take more than prescribed.  Uterine Fibroid A uterine fibroid is a growth (tumor) that occurs in your uterus. This type of tumor is not cancerous and does not spread out of the uterus. You can have one or many fibroids. Fibroids can vary in size, weight, and where they grow in the uterus. Some can become quite large. Most fibroids do not require medical treatment, but some can cause pain or heavy bleeding during and between periods. CAUSES  A fibroid is the result of a single uterine cell that keeps growing (unregulated), which is different than most cells in the human body. Most cells have a control mechanism that keeps them from reproducing without control.  SIGNS AND SYMPTOMS   Bleeding.  Pelvic pain and pressure.  Bladder problems due to the size of the fibroid.  Infertility and miscarriages depending on the size and location of the fibroid. DIAGNOSIS  Uterine fibroids are diagnosed through a physical exam. Your health care provider may feel the lumpy tumors during a pelvic exam. Ultrasonography may be done to get information regarding size, location, and number of tumors.  TREATMENT   Your health care provider may recommend watchful waiting. This involves getting the fibroid checked by your health care provider to see if it grows or shrinks.   Hormone treatment or an intrauterine device (IUD) may be prescribed.   Surgery may be needed to remove the fibroids (myomectomy) or the uterus (hysterectomy). This depends on your situation. When fibroids interfere with fertility and a woman wants to become pregnant, a health care provider may recommend having the fibroids removed.  [place] care depends on how you were treated. In general:   Keep all follow-up appointments with your health care provider.   Only take  over-the-counter or prescription medicines as directed by your health care provider. If you were prescribed a hormone treatment, take the hormone medicines exactly as directed. Do not take aspirin. It can cause bleeding.   Talk to your health care provider about taking iron pills.  If your periods are troublesome but not so heavy, lie down with your feet raised slightly above your heart. Place cold packs on your lower abdomen.   If your periods are heavy, write down the number of pads or tampons you use per month. Bring this information to your health care provider.   Include green vegetables in your diet.  SEEK IMMEDIATE MEDICAL CARE IF:  You have pelvic pain or cramps not controlled with medicines.   You have a sudden increase in pelvic pain.   You have an increase in bleeding between and during periods.   You have excessive periods and soak tampons or pads in a half hour or less.  You feel lightheaded or have fainting episodes. Document Released: 05/15/2000 Document Revised: 03/08/2013 Document Reviewed: 12/15/2012 [facility] Patient Information 2015 [place], [place]. This information is not intended to replace advice given to you by your health care provider. Make sure you discuss any questions you have with your health care provider.

## 2015-01-16 NOTE — ED Notes (Signed)
Reports lower abd pain x 1 month, denies dc.  occasional nausea

## 2015-01-16 NOTE — ED Provider Notes (Signed)
[facility] Emergency Department Provider Note  ____________________________________________  Time seen: Approximately 1:26 PM  I have reviewed the triage vital signs and the nursing notes.   HISTORY  Chief Complaint Abdominal Pain    HPI Andrea Stephens is a 29 y.o. female who comes emergency room reporting that she is having ongoing pain in her lower pelvis for approximately 2 months. She ran out of recent prescription for Toradol which was given to her in the emergency room. Because her health insurance does not take effect until September 1, she has come requesting for extension of her prescription medicine. Denies any new symptoms. No fevers or chills. Reports that she gets out of a 9 out of 10 pelvic pain in the suprapubic area which is relatively constant throughout the day for the last 1 month, but has also had this ongoing issue for approximately 3-4 months proceeding.  Denies pregnancy. Patient is a lesbian.  No vaginal discharge. No vaginal bleeding. No pain in the upper abdomen.  No fevers or chills. No change in urination.  No sudden changes in the quality or type of pain.  History reviewed. No pertinent past medical history.  There are no active problems to display for this patient.   History reviewed. No pertinent past surgical history.  Current Outpatient Rx  Name  Route  Sig  Dispense  Refill  . ketorolac (TORADOL) 10 MG tablet   Oral   Take 1 tablet (10 mg total) by mouth every 8 (eight) hours as needed.   20 tablet   0     Allergies Review of patient's allergies indicates no known allergies.  History reviewed. No pertinent family history.  Social History Social History  Substance Use Topics  . Smoking status: Never Smoker   . Smokeless tobacco: None  . Alcohol Use: No    Review of Systems Constitutional: No fever/chills Eyes: No visual changes. ENT: No sore throat. Cardiovascular: Denies chest pain. Respiratory:  Denies shortness of breath. Gastrointestinal: See history of present illness No nausea, no vomiting.  No diarrhea.  No constipation. Genitourinary: Negative for dysuria. Musculoskeletal: Negative for back pain. Skin: Negative for rash. Neurological: Negative for headaches, focal weakness or numbness.  10-point ROS otherwise negative.  ____________________________________________   PHYSICAL EXAM:  VITAL SIGNS: ED Triage Vitals  Enc Vitals Group     BP 01/16/15 1032 96/56 mmHg     Pulse Rate 01/16/15 1032 81     Resp 01/16/15 1032 18     Temp 01/16/15 1032 98.6 F (37 C)     Temp Source 01/16/15 1032 Oral     SpO2 01/16/15 1032 97 %     Weight 01/16/15 1032 147 lb (66.679 kg)     Height 01/16/15 1032 6\' 2"  (1.88 m)     Head Cir --      Peak Flow --      Pain Score 01/16/15 1033 7     Pain Loc --      Pain Edu? --      Excl. in [place]? --     Constitutional: Alert and oriented. Well appearing and in no acute distress. Patient has a somewhat masculine voice. Well built and muscular. Eyes: Conjunctivae are normal. PERRL. EOMI. Head: Atraumatic. Nose: No congestion/rhinnorhea. Mouth/Throat: Mucous membranes are moist.  Oropharynx non-erythematous. Neck: No stridor.   Cardiovascular: Normal rate, regular rhythm. Grossly normal heart sounds.  Good peripheral circulation. Respiratory: Normal respiratory effort.  No retractions. Lungs CTAB. Gastrointestinal: Soft and  nontender except for some minimal tenderness over the midline suprapubic region without rebound or guarding. No distention. No abdominal bruits. No CVA tenderness. Musculoskeletal: No lower extremity tenderness nor edema.  No joint effusions. Neurologic:  Normal speech and language. No gross focal neurologic deficits are appreciated. No gait instability. Skin:  Skin is warm, dry and intact. No rash noted. Psychiatric: Mood and affect are normal. Speech and behavior are  normal.  ____________________________________________   LABS (all labs ordered are listed, but only abnormal results are displayed)  Labs Reviewed  COMPREHENSIVE METABOLIC PANEL - Abnormal; Notable for the following:    Sodium 134 (*)    Glucose, Bld 116 (*)    Creatinine, Ser 1.09 (*)    Calcium 8.8 (*)    ALT 13 (*)    Alkaline Phosphatase 29 (*)    All other components within normal limits  CBC WITH DIFFERENTIAL/PLATELET - Abnormal; Notable for the following:    RDW 15.0 (*)    All other components within normal limits  URINALYSIS COMPLETEWITH MICROSCOPIC ([facility] ONLY) - Abnormal; Notable for the following:    Color, Urine YELLOW (*)    APPearance CLEAR (*)    Squamous Epithelial / LPF 0-5 (*)    All other components within normal limits  LIPASE, BLOOD  POCT PREGNANCY, URINE  POC URINE PREG, ED   ____________________________________________  EKG   ____________________________________________  RADIOLOGY  Ultrasound on last visit that demonstrated leiomyoma ____________________________________________   PROCEDURES  Procedure(s) performed: None  Critical Care performed: No  ____________________________________________   INITIAL IMPRESSION / ASSESSMENT AND PLAN / ED COURSE  Pertinent labs & imaging results that were available during my care of the patient were reviewed by me and considered in my medical decision making (see chart for details).  Patient presents for concerns of a refill of medication. I discussed the patient that I do not think it would be wise to continue on Toradol for that long a period of time. Instead I recommended over-the-counter medications like ibuprofen or Tylenol, with the patient feels is also reasonable. No new type of pain, no new symptoms. No infectious or systemic symptoms. Patient has follow-up planned with OB/GYN. ____________________________________________   FINAL CLINICAL IMPRESSION(S) / ED DIAGNOSES  Final diagnoses:   Leiomyoma      Delman Kitten, MD 01/16/15 1329

## 2015-03-19 ENCOUNTER — Encounter: Payer: Self-pay | Admitting: *Deleted

## 2015-03-19 ENCOUNTER — Emergency Department
Admission: EM | Admit: 2015-03-19 | Discharge: 2015-03-19 | Disposition: A | Discharge status: Home / Self Care | Payer: Self-pay | Attending: Emergency Medicine | Admitting: Emergency Medicine

## 2015-03-19 DIAGNOSIS — J029 Acute pharyngitis, unspecified: Secondary | ICD-10-CM | POA: Insufficient documentation

## 2015-03-19 DIAGNOSIS — B349 Viral infection, unspecified: Secondary | ICD-10-CM | POA: Insufficient documentation

## 2015-03-19 MED ORDER — DIPHENOXYLATE-ATROPINE 2.5-0.025 MG PO TABS: 1.0000 | ORAL_TABLET | Freq: Four times a day (QID) | ORAL | Status: DC | PRN

## 2015-03-19 MED ORDER — ONDANSETRON 8 MG PO TBDP
8.0000 mg | ORAL_TABLET | Freq: Once | ORAL | Status: AC
  Administered 2015-03-19: 8 mg via ORAL
  Filled 2015-03-19: qty 1

## 2015-03-19 MED ORDER — IBUPROFEN 800 MG PO TABS: 800.0000 mg | ORAL_TABLET | Freq: Three times a day (TID) | ORAL | Status: DC | PRN

## 2015-03-19 MED ORDER — KETOROLAC TROMETHAMINE 60 MG/2ML IM SOLN
60.0000 mg | Freq: Once | INTRAMUSCULAR | Status: AC
  Administered 2015-03-19: 60 mg via INTRAMUSCULAR
  Filled 2015-03-19: qty 2

## 2015-03-19 MED ORDER — PROMETHAZINE-DM 6.25-15 MG/5ML PO SYRP: 5.0000 mL | ORAL_SOLUTION | Freq: Four times a day (QID) | ORAL | Status: DC | PRN

## 2015-03-19 MED ORDER — DIPHENOXYLATE-ATROPINE 2.5-0.025 MG PO TABS
2.0000 | ORAL_TABLET | Freq: Once | ORAL | Status: AC
  Administered 2015-03-19: 2 via ORAL
  Filled 2015-03-19: qty 2

## 2015-03-19 NOTE — ED Provider Notes (Signed)
[facility] Emergency Department Provider Note  ____________________________________________  Time seen: Approximately 6:10 PM  I have reviewed the triage vital signs and the nursing notes.   HISTORY  Chief Complaint URI    HPI Andrea Stephens is a 29 y.o. female patient complaining of flulike symptoms last 3 days. Complaint consist fever chills body aches and coughing. Patient also states she's had diarrhea for 4 days. Patient complaining of nausea today but no vomiting no palliative measures taken for this complaint. Patient rated his pain discomfort as 8/10.    History reviewed. No pertinent past medical history.  There are no active problems to display for this patient.   History reviewed. No pertinent past surgical history.  Current Outpatient Rx  Name  Route  Sig  Dispense  Refill  . diphenoxylate-atropine (LOMOTIL) 2.5-0.025 MG tablet   Oral   Take 1 tablet by mouth 4 (four) times daily as needed for diarrhea or loose stools.   16 tablet   0   . ibuprofen (ADVIL,MOTRIN) 800 MG tablet   Oral   Take 1 tablet (800 mg total) by mouth every 8 (eight) hours as needed for moderate pain.   15 tablet   0   . ketorolac (TORADOL) 10 MG tablet   Oral   Take 1 tablet (10 mg total) by mouth every 8 (eight) hours as needed.   20 tablet   0   . promethazine-dextromethorphan (PROMETHAZINE-DM) 6.25-15 MG/5ML syrup   Oral   Take 5 mLs by mouth 4 (four) times daily as needed for cough.   118 mL   0     Allergies Review of patient's allergies indicates no known allergies.  No family history on file.  Social History Social History  Substance Use Topics  . Smoking status: Never Smoker   . Smokeless tobacco: None  . Alcohol Use: No    Review of Systems Constitutional: Fever and chills Eyes: No visual changes. ENT: Sore throat Cardiovascular: Denies chest pain. Respiratory: Denies shortness of breath. Gastrointestinal: No abdominal pain.   Nausea but no vomiting.   diarrhea.  No constipation. Genitourinary: Negative for dysuria. Musculoskeletal: Body aches Skin: Negative for rash. Neurological: Negative for headaches, focal weakness or numbness. 10-point ROS otherwise negative.  ____________________________________________   PHYSICAL EXAM:  VITAL SIGNS: ED Triage Vitals  Enc Vitals Group     BP 03/19/15 1748 104/68 mmHg     Pulse Rate 03/19/15 1748 97     Resp 03/19/15 1748 18     Temp 03/19/15 1748 99.3 F (37.4 C)     Temp Source 03/19/15 1748 Oral     SpO2 03/19/15 1748 97 %     Weight --      Height --      Head Cir --      Peak Flow --      Pain Score 03/19/15 1748 8     Pain Loc --      Pain Edu? --      Excl. in [place]? --     Constitutional: Alert and oriented. Appears malaise Eyes: Conjunctivae are normal. PERRL. EOMI. Head: Atraumatic. Nose: Bilateral sinus guarding, edematous nasal turbinates and clear rhinorrhea  Mouth/Throat: Mucous membranes are moist.  Oropharynx non-erythematous. Neck: No stridor.  No cervical spine tenderness to palpation. Hematological/Lymphatic/Immunilogical: No cervical lymphadenopathy. Cardiovascular: Normal rate, regular rhythm. Grossly normal heart sounds.  Good peripheral circulation. Respiratory: Normal respiratory effort.  No retractions. Lungs CTAB. Gastrointestinal: Soft and nontender. No distention.  No abdominal bruits. No CVA tenderness. Hyperactive bowel sounds. Musculoskeletal: No lower extremity tenderness nor edema.  No joint effusions. Neurologic:  Normal speech and language. No gross focal neurologic deficits are appreciated. No gait instability. Skin:  Skin is warm, dry and intact. No rash noted. Psychiatric: Mood and affect are normal. Speech and behavior are normal.  ____________________________________________   LABS (all labs ordered are listed, but only abnormal results are displayed)  Labs Reviewed - No data to  display ____________________________________________  EKG   ____________________________________________  RADIOLOGY   ____________________________________________   PROCEDURES  Procedure(s) performed: None  Critical Care performed: No  ____________________________________________   INITIAL IMPRESSION / ASSESSMENT AND PLAN / ED COURSE  Pertinent labs & imaging results that were available during my care of the patient were reviewed by me and considered in my medical decision making (see chart for details).  Viral illness. Patient given a prescription for Phenergan DM, Lomotil, and ibuprofen. Patient given home structure for viral illness. Patient [facility]. ____________________________________________   FINAL CLINICAL IMPRESSION(S) / ED DIAGNOSES  Final diagnoses:  Viral illness      Sable Feil, PA-C 03/19/15 1814  Daymon Larsen, MD 03/20/15 1525

## 2015-03-19 NOTE — ED Notes (Signed)
Pt reports flu like symptoms for about 2-3 days, body aches, chills, cough, over all body pain, fevers. Last took med for cold symptoms about an hour ago.

## 2015-03-19 NOTE — Discharge Instructions (Signed)

## 2015-05-20 ENCOUNTER — Encounter: Payer: Self-pay | Admitting: Urgent Care

## 2015-05-20 DIAGNOSIS — R0981 Nasal congestion: Secondary | ICD-10-CM | POA: Insufficient documentation

## 2015-05-20 DIAGNOSIS — R51 Headache: Secondary | ICD-10-CM | POA: Insufficient documentation

## 2015-05-20 DIAGNOSIS — F17200 Nicotine dependence, unspecified, uncomplicated: Secondary | ICD-10-CM | POA: Insufficient documentation

## 2015-05-20 NOTE — ED Notes (Signed)
Patient presents with c/o facial pain and congestion x 2 days. Denies fever.

## 2015-05-21 ENCOUNTER — Emergency Department
Admission: EM | Admit: 2015-05-21 | Discharge: 2015-05-21 | Discharge status: Against Medical Advice | Payer: Self-pay | Attending: Emergency Medicine | Admitting: Emergency Medicine

## 2015-06-09 ENCOUNTER — Emergency Department: Payer: Self-pay

## 2015-06-09 ENCOUNTER — Emergency Department
Admission: EM | Admit: 2015-06-09 | Discharge: 2015-06-09 | Disposition: A | Discharge status: Home / Self Care | Payer: Self-pay | Attending: Emergency Medicine | Admitting: Emergency Medicine

## 2015-06-09 ENCOUNTER — Encounter: Payer: Self-pay | Admitting: Emergency Medicine

## 2015-06-09 DIAGNOSIS — Y998 Other external cause status: Secondary | ICD-10-CM | POA: Insufficient documentation

## 2015-06-09 DIAGNOSIS — F17200 Nicotine dependence, unspecified, uncomplicated: Secondary | ICD-10-CM | POA: Insufficient documentation

## 2015-06-09 DIAGNOSIS — S60221A Contusion of right hand, initial encounter: Secondary | ICD-10-CM | POA: Insufficient documentation

## 2015-06-09 DIAGNOSIS — W231XXA Caught, crushed, jammed, or pinched between stationary objects, initial encounter: Secondary | ICD-10-CM | POA: Insufficient documentation

## 2015-06-09 DIAGNOSIS — Y9389 Activity, other specified: Secondary | ICD-10-CM | POA: Insufficient documentation

## 2015-06-09 DIAGNOSIS — Y9289 Other specified places as the place of occurrence of the external cause: Secondary | ICD-10-CM | POA: Insufficient documentation

## 2015-06-09 DIAGNOSIS — J069 Acute upper respiratory infection, unspecified: Secondary | ICD-10-CM | POA: Insufficient documentation

## 2015-06-09 MED ORDER — IBUPROFEN 800 MG PO TABS: 800.0000 mg | ORAL_TABLET | Freq: Three times a day (TID) | ORAL | Status: DC | PRN

## 2015-06-09 MED ORDER — GUAIFENESIN-CODEINE 100-10 MG/5ML PO SOLN: 10.0000 mL | ORAL | Status: DC | PRN

## 2015-06-09 NOTE — Discharge Instructions (Signed)
Hand Contusion A hand contusion is a deep bruise on your hand area. Contusions are the result of an injury that caused bleeding under the skin. The contusion may turn blue, purple, or yellow. Minor injuries will give you a painless contusion, but more severe contusions may stay painful and swollen for a few weeks. CAUSES  A contusion is usually caused by a blow, trauma, or direct force to an area of the body. SYMPTOMS   Swelling and redness of the injured area.  Discoloration of the injured area.  Tenderness and soreness of the injured area.  Pain. DIAGNOSIS  The diagnosis can be made by taking a history and performing a physical exam. An X-ray, CT scan, or MRI may be needed to determine if there were any associated injuries, such as broken bones (fractures). TREATMENT  Often, the best treatment for a hand contusion is resting, elevating, icing, and applying cold compresses to the injured area. Over-the-counter medicines may also be recommended for pain control. HOME CARE INSTRUCTIONS   Put ice on the injured area.  Put ice in a plastic bag.  Place a towel between your skin and the bag.  Leave the ice on for 15-20 minutes, 03-04 times a day.  Only take over-the-counter or prescription medicines as directed by your caregiver. Your caregiver may recommend avoiding anti-inflammatory medicines (aspirin, ibuprofen, and naproxen) for 48 hours because these medicines may increase bruising.  If told, use an elastic wrap as directed. This can help reduce swelling. You may remove the wrap for sleeping, showering, and bathing. If your fingers become numb, cold, or blue, take the wrap off and reapply it more loosely.  Elevate your hand with pillows to reduce swelling.  Avoid overusing your hand if it is painful. SEEK IMMEDIATE MEDICAL CARE IF:   You have increased redness, swelling, or pain in your hand.  Your swelling or pain is not relieved with medicines.  You have loss of feeling in  your hand or are unable to move your fingers.  Your hand turns cold or blue.  You have pain when you move your fingers.  Your hand becomes warm to the touch.  Your contusion does not improve in 2 days. MAKE SURE YOU:   Understand these instructions.  Will watch your condition.  Will get help right away if you are not doing well or get worse.   This information is not intended to replace advice given to you by your health care provider. Make sure you discuss any questions you have with your health care provider.   Document Released: 11/07/2001 Document Revised: 02/10/2012 Document Reviewed: 11/09/2011 Elsevier Interactive Patient Education 2016 [employer]  Upper Respiratory Infection, Adult Most upper respiratory infections (URIs) are a viral infection of the air passages leading to the lungs. A URI affects the nose, throat, and upper air passages. The most common type of URI is nasopharyngitis and is typically referred to as "the common cold." URIs run their course and usually go away on their own. Most of the time, a URI does not require medical attention, but sometimes a bacterial infection in the upper airways can follow a viral infection. This is called a secondary infection. Sinus and middle ear infections are common types of secondary upper respiratory infections. Bacterial pneumonia can also complicate a URI. A URI can worsen asthma and chronic obstructive pulmonary disease (COPD). Sometimes, these complications can require emergency medical care and may be life threatening.  CAUSES Almost all URIs are caused by viruses. A  virus is a type of germ and can spread from one person to another.  RISKS FACTORS You may be at risk for a URI if:   You smoke.   You have chronic heart or lung disease.  You have a weakened defense (immune) system.   You are very young or very old.   You have nasal allergies or asthma.  You work in crowded or poorly ventilated areas.  You  work in health care facilities or schools. SIGNS AND SYMPTOMS  Symptoms typically develop 2-3 days after you come in contact with a cold virus. Most viral URIs last 7-10 days. However, viral URIs from the influenza virus (flu virus) can last 14-18 days and are typically more severe. Symptoms may include:   Runny or stuffy (congested) nose.   Sneezing.   Cough.   Sore throat.   Headache.   Fatigue.   Fever.   Loss of appetite.   Pain in your forehead, behind your eyes, and over your cheekbones (sinus pain).  Muscle aches.  DIAGNOSIS  Your health care provider may diagnose a URI by:  Physical exam.  Tests to check that your symptoms are not due to another condition such as:  Strep throat.  Sinusitis.  Pneumonia.  Asthma. TREATMENT  A URI goes away on its own with time. It cannot be cured with medicines, but medicines may be prescribed or recommended to relieve symptoms. Medicines may help:  Reduce your fever.  Reduce your cough.  Relieve nasal congestion. HOME CARE INSTRUCTIONS   Take medicines only as directed by your health care provider.   Gargle warm saltwater or take cough drops to comfort your throat as directed by your health care provider.  Use a warm mist humidifier or inhale steam from a shower to increase air moisture. This may make it easier to breathe.  Drink enough fluid to keep your urine clear or pale yellow.   Eat soups and other clear broths and maintain good nutrition.   Rest as needed.   Return to work when your temperature has returned to normal or as your health care provider advises. You may need to stay home longer to avoid infecting others. You can also use a face mask and careful hand washing to prevent spread of the virus.  Increase the usage of your inhaler if you have asthma.   Do not use any tobacco products, including cigarettes, chewing tobacco, or electronic cigarettes. If you need help quitting, ask your  health care provider. PREVENTION  The best way to protect yourself from getting a cold is to practice good hygiene.   Avoid oral or hand contact with people with cold symptoms.   Wash your hands often if contact occurs.  There is no clear evidence that vitamin C, vitamin E, echinacea, or exercise reduces the chance of developing a cold. However, it is always recommended to get plenty of rest, exercise, and practice good nutrition.  SEEK MEDICAL CARE IF:   You are getting worse rather than better.   Your symptoms are not controlled by medicine.   You have chills.  You have worsening shortness of breath.  You have brown or red mucus.  You have yellow or brown nasal discharge.  You have pain in your face, especially when you bend forward.  You have a fever.  You have swollen neck glands.  You have pain while swallowing.  You have white areas in the back of your throat. SEEK IMMEDIATE MEDICAL CARE IF:  You have severe or persistent:  Headache.  Ear pain.  Sinus pain.  Chest pain.  You have chronic lung disease and any of the following:  Wheezing.  Prolonged cough.  Coughing up blood.  A change in your usual mucus.  You have a stiff neck.  You have changes in your:  Vision.  Hearing.  Thinking.  Mood. MAKE SURE YOU:   Understand these instructions.  Will watch your condition.  Will get help right away if you are not doing well or get worse.   This information is not intended to replace advice given to you by your health care provider. Make sure you discuss any questions you have with your health care provider.   Document Released: 11/11/2000 Document Revised: 10/02/2014 Document Reviewed: 08/23/2013 Elsevier Interactive Patient Education [employer].

## 2015-06-09 NOTE — ED Notes (Signed)
Pt shut 2nd digit right hand in car yesterday. No deformity or swelling noted.  Also has had cough last 3 days.

## 2015-06-09 NOTE — ED Provider Notes (Signed)
[facility] Emergency Department Provider Note  ____________________________________________  Time seen: Approximately 5:14 PM  I have reviewed the triage vital signs and the nursing notes.   HISTORY  Chief Complaint Cough and Finger Injury   HPI Andrea Stephens is a 30 y.o. female Libby Maw for evaluation of finger pain after getting it caught in a door. In addition complains of coughing with productive sputum 3 days.   History reviewed. No pertinent past medical history.  There are no active problems to display for this patient.   History reviewed. No pertinent past surgical history.  Current Outpatient Rx  Name  Route  Sig  Dispense  Refill  . guaiFENesin-codeine 100-10 MG/5ML syrup   Oral   Take 10 mLs by mouth every 4 (four) hours as needed for cough.   180 mL   0   . ibuprofen (ADVIL,MOTRIN) 800 MG tablet   Oral   Take 1 tablet (800 mg total) by mouth every 8 (eight) hours as needed.   30 tablet   0     Allergies Review of patient's allergies indicates no known allergies.  History reviewed. No pertinent family history.  Social History Social History  Substance Use Topics  . Smoking status: Current Every Day Smoker  . Smokeless tobacco: None  . Alcohol Use: No    Review of Systems Constitutional: No fever/chills Eyes: No visual changes. ENT: No sore throat. Cardiovascular: Denies chest pain. Respiratory: Denies shortness of breath. Positive for cough. Gastrointestinal: No abdominal pain.  No nausea, no vomiting.  No diarrhea.  No constipation. Genitourinary: Negative for dysuria. Musculoskeletal: Positive for right hand pain Skin: Negative for rash. Neurological: Negative for headaches, focal weakness or numbness.  10-point ROS otherwise negative.  ____________________________________________   PHYSICAL EXAM:  VITAL SIGNS: ED Triage Vitals  Enc Vitals Group     BP 06/09/15 1629 108/61 mmHg     Pulse Rate 06/09/15  1629 81     Resp 06/09/15 1629 16     Temp 06/09/15 1629 97.7 F (36.5 C)     Temp Source 06/09/15 1629 Oral     SpO2 06/09/15 1629 97 %     Weight 06/09/15 1629 135 lb (61.236 kg)     Height 06/09/15 1629 6\' 1"  (1.854 m)     Head Cir --      Peak Flow --      Pain Score 06/09/15 1629 8     Pain Loc --      Pain Edu? --      Excl. in [place]? --     Constitutional: Alert and oriented. Well appearing and in no acute distress. Eyes: Conjunctivae are normal. PERRL. EOMI. Head: Atraumatic. Nose: No congestion/rhinnorhea. Mouth/Throat: Mucous membranes are moist.  Oropharynx non-erythematous. Neck: No stridor.   Cardiovascular: Normal rate, regular rhythm. Grossly normal heart sounds.  Good peripheral circulation. Respiratory: Normal respiratory effort.  No retractions. Lungs CTAB. Gastrointestinal: Soft and nontender. No distention. No abdominal bruits. No CVA tenderness. Musculoskeletal: No lower extremity tenderness nor edema.  No joint effusions. Neurologic:  Normal speech and language. No gross focal neurologic deficits are appreciated. No gait instability. Skin:  Skin is warm, dry and intact. No rash noted. Psychiatric: Mood and affect are normal. Speech and behavior are normal.  ____________________________________________   LABS (all labs ordered are listed, but only abnormal results are displayed)  Labs Reviewed - No data to display ____________________________________________  EKG   ____________________________________________  RADIOLOGY  Chest x-ray negative for  any acute pulmonary findings. Hand x-ray negative for osseous findings ____________________________________________   PROCEDURES  Procedure(s) performed: None  Critical Care performed: No  ____________________________________________   INITIAL IMPRESSION / ASSESSMENT AND PLAN / ED COURSE  Pertinent labs & imaging results that were available during my care of the patient were reviewed by me and  considered in my medical decision making (see chart for details).  Date of respiratory infections last) contusion. Rx given for Motrin 800 mg and Robitussin-AC. Patient follow-up with PCP or return to the ER with any worsening symptomology. Patient voices no other emergency medical complaints at this time. ____________________________________________   FINAL CLINICAL IMPRESSION(S) / ED DIAGNOSES  Final diagnoses:  Hand contusion, right, initial encounter  Acute URI      Arlyss Repress, PA-C 06/09/15 1744  Nance Pear, MD 06/09/15 [phone]

## 2015-06-09 NOTE — ED Notes (Addendum)
Pt states jammed her finger in the door yesterday. Pt is able to move finger but c/o pain when doing so.

## 2015-08-09 ENCOUNTER — Emergency Department
Admission: EM | Admit: 2015-08-09 | Discharge: 2015-08-09 | Disposition: A | Discharge status: Home / Self Care | Payer: Self-pay | Attending: Emergency Medicine | Admitting: Emergency Medicine

## 2015-08-09 ENCOUNTER — Encounter: Payer: Self-pay | Admitting: Emergency Medicine

## 2015-08-09 DIAGNOSIS — R059 Cough, unspecified: Secondary | ICD-10-CM

## 2015-08-09 DIAGNOSIS — K029 Dental caries, unspecified: Secondary | ICD-10-CM | POA: Insufficient documentation

## 2015-08-09 DIAGNOSIS — K047 Periapical abscess without sinus: Secondary | ICD-10-CM | POA: Insufficient documentation

## 2015-08-09 DIAGNOSIS — F17200 Nicotine dependence, unspecified, uncomplicated: Secondary | ICD-10-CM | POA: Insufficient documentation

## 2015-08-09 DIAGNOSIS — R05 Cough: Secondary | ICD-10-CM | POA: Insufficient documentation

## 2015-08-09 MED ORDER — BENZONATATE 100 MG PO CAPS: 100.0000 mg | ORAL_CAPSULE | Freq: Three times a day (TID) | ORAL | Status: DC | PRN

## 2015-08-09 MED ORDER — PENICILLIN V POTASSIUM 500 MG PO TABS: 500.0000 mg | ORAL_TABLET | Freq: Four times a day (QID) | ORAL | Status: DC

## 2015-08-09 NOTE — ED Notes (Signed)
Reports toothache , swollen this am.  No resp distress

## 2015-08-09 NOTE — Discharge Instructions (Signed)
Cough, Adult A cough helps to clear your throat and lungs. A cough may last only 2-3 weeks (acute), or it may last longer than 8 weeks (chronic). Many different things can cause a cough. A cough may be a sign of an illness or another medical condition. HOME CARE  Pay attention to any changes in your cough.  Take medicines only as told by your doctor.  If you were prescribed an antibiotic medicine, take it as told by your doctor. Do not stop taking it even if you start to feel better.  Talk with your doctor before you try using a cough medicine.  Drink enough fluid to keep your pee (urine) clear or pale yellow.  If the air is dry, use a cold steam vaporizer or humidifier in your home.  Stay away from things that make you cough at work or at home.  If your cough is worse at night, try using extra pillows to raise your head up higher while you sleep.  Do not smoke, and try not to be around smoke. If you need help quitting, ask your doctor.  Do not have caffeine.  Do not drink alcohol.  Rest as needed. GET HELP IF:  You have new problems (symptoms).  You cough up yellow fluid (pus).  Your cough does not get better after 2-3 weeks, or your cough gets worse.  Medicine does not help your cough and you are not sleeping well.  You have pain that gets worse or pain that is not helped with medicine.  You have a fever.  You are losing weight and you do not know why.  You have night sweats. GET HELP RIGHT AWAY IF:  You cough up blood.  You have trouble breathing.  Your heartbeat is very fast.   This information is not intended to replace advice given to you by your health care provider. Make sure you discuss any questions you have with your health care provider.   Document Released: 01/29/2011 Document Revised: 02/06/2015 Document Reviewed: 07/25/2014 Elsevier Interactive Patient Education 2016 [place] Abscess A dental abscess is pus in or around a  tooth. HOME CARE  Take medicines only as told by your dentist.  If you were prescribed antibiotic medicine, finish all of it even if you start to feel better.  Rinse your mouth (gargle) often with salt water.  Do not drive or use heavy machinery, like a lawn mower, while taking pain medicine.  Do not apply heat to the outside of your mouth.  Keep all follow-up visits as told by your dentist. This is important. GET HELP IF:  Your pain is worse, and medicine does not help. GET HELP RIGHT AWAY IF:  You have a fever or chills.  Your symptoms suddenly get worse.  You have a very bad headache.  You have problems breathing or swallowing.  You have trouble opening your mouth.  You have puffiness (swelling) in your neck or around your eye.   This information is not intended to replace advice given to you by your health care provider. Make sure you discuss any questions you have with your health care provider.   Document Released: 10/02/2014 Document Reviewed: 10/02/2014 Elsevier Interactive Patient Education 2016 [place] the antibiotic as directed. Rinse after every meal with warm, salty water. Brush with a soft-bristled toothbrush. Follow-up with one of the dental providers listed below.   OPTIONS FOR DENTAL FOLLOW UP CARE   Department of Health and Human  [place] [email or web address] [phone])  [facility] [phone])  [place] [phone])  [facility] [phone])  [facility] [phone]) This clinic caters to the indigent population and is on a lottery system. Location: [facility], [place], [place], [place] Clinic Hours: Wednesdays from 6pm - 9pm, patients seen by a lottery system. For dates, call or go to  [email or web address] Services: Cleanings, fillings and simple extractions. Payment Options: DENTAL WORK IS FREE OF CHARGE. Bring proof of income or support. Best way to get seen: Arrive at 5:15 pm - this is a lottery, NOT first come/first serve, so arriving earlier will not increase your chances of being seen.     [facility] [phone] Select option 1 for emergencies   Location: [facility], [place], [address] [place] Clinic Hours: No walk-ins accepted - call the day before to schedule an appointment. Check in times are 9:30 am and 1:30 pm. Services: Simple extractions, temporary fillings, pulpectomy/pulp debridement, uncomplicated abscess drainage. Payment Options: PAYMENT IS DUE AT THE TIME OF SERVICE.  Fee is usually $100-200, additional surgical procedures (e.g. abscess drainage) may be extra. Cash, checks, Visa/MasterCard accepted.  Can file Medicaid if patient is covered for dental - patient should call case worker to check. No discount for [facility] patients. Best way to get seen: MUST call the day before and get onto the schedule. Can usually be seen the next 1-2 days. No walk-ins accepted.     [place] [phone]   Location: [place], [place] Clinic Hours: M, W, Th, F 8am or 1:30pm, Tues 9a or 1:30 - first come/first served. Services: Simple extractions, temporary fillings, uncomplicated abscess drainage.  You do not need to be an [facility] resident. Payment Options: PAYMENT IS DUE AT THE TIME OF SERVICE. Dental insurance, otherwise sliding scale - bring proof of income or support. Depending on income and treatment needed, cost is usually $50-200. Best way to get seen: Arrive early as it is first come/first served.     [facility] [phone]   Location: [place] [facility] Hours: Mon-Thu 8a-5p Services: Most basic dental services including extractions and fillings. Payment Options: PAYMENT IS DUE AT THE TIME OF SERVICE. Sliding scale, up to 50% off - bring proof if income or support. Medicaid with dental option accepted. Best way to get seen: Call to schedule an appointment, can usually be seen within 2 weeks OR they will try to see walk-ins - show up at [place] or 2p (you may have to wait).     [facility] [place] RESIDENTS ONLY   Location: [facility], [place] [address] [place], [place] [address] Clinic Hours: By appointment only. Monday - Thursday 8am-5pm, Friday 8am-12pm Services: Cleanings, fillings, extractions. Payment Options: PAYMENT IS DUE AT THE TIME OF SERVICE. Cash, Visa or MasterCard. Sliding scale - $30 minimum per service. Best way to get seen: Come in to office, complete packet and make an appointment - need proof of income or support monies for each household member and proof of [facility] residence. Usually takes about a month to get in.     [facility] [phone]   Location: [place]., [place] [facility] Hours: Walk-in Urgent Care Dental Services are offered Monday-Friday mornings  only. The numbers of emergencies accepted daily is limited to the number of providers available. Maximum 15 - Mondays, Wednesdays & Thursdays Maximum 10 - Tuesdays & Fridays Services: You do not need to be a [facility] resident to be seen for a dental emergency. Emergencies are defined as pain, swelling, abnormal bleeding, or dental trauma. Walkins will receive x-rays if needed. NOTE: Dental cleaning is not an emergency. Payment Options: PAYMENT IS DUE AT THE TIME OF SERVICE. Minimum co-pay is $40.00 for uninsured patients. Minimum co-pay is $3.00 for Medicaid with dental coverage. Dental Insurance is accepted and must be presented at time of  visit. Medicare does not cover dental. Forms of payment: Cash, credit card, checks. Best way to get seen: If not previously registered with the clinic, walk-in dental registration begins at 7:15 am and is on a first come/first serve basis. If previously registered with the clinic, call to make an appointment.     [facility] [place] ONLY   Location: [address], [place], [place] Clinic Hours: Mon-Thu 10a-2p Services: Extractions only! Payment Options: FREE (donations accepted) - bring proof of income or support Best way to get seen: Call and schedule an appointment OR come at 8am on the 1st Monday of every month (except for holidays) when it is first come/first served.     [facility] [phone]   Location: [place], [place] [facility] Hours: Friday mornings Services, Payment Options, Best way to get seen: Call for info

## 2015-08-09 NOTE — ED Provider Notes (Signed)
[facility] Emergency Department Provider Note ____________________________________________  Time seen: 1107  I have reviewed the triage vital signs and the nursing notes.  HISTORY  Chief Complaint  Dental Pain  HPI Andrea Stephens is a 30 y.o. female resistance to the ED for evaluation of pain and swelling to the left lower jaw secondary to a cavity. She admits to swelling to his left lower jaw for the last 2 days, has been aware of the cavity to that same molar for the last several months. She's been applying cold and hot compresses to the lower face without significant benefit. She also notes some subjective fevers. She denies any nausea, vomiting, or frank discharge from the gums. She does not have a current dental provider.Patient's girlfriend is present in the room and verbalizes the patient with a nighttime cough that causes some disruption of sleep. The patient has not admitted to any cough during this interview. She rates the discomfort to the left lower jaw at 8/10 in triage.  History reviewed. No pertinent past medical history.  There are no active problems to display for this patient.   History reviewed. No pertinent past surgical history.  Current Outpatient Rx  Name  Route  Sig  Dispense  Refill  . benzonatate (TESSALON PERLES) 100 MG capsule   Oral   Take 1 capsule (100 mg total) by mouth 3 (three) times daily as needed for cough (Take 1-2 per dose).   30 capsule   0   . guaiFENesin-codeine 100-10 MG/5ML syrup   Oral   Take 10 mLs by mouth every 4 (four) hours as needed for cough.   180 mL   0   . ibuprofen (ADVIL,MOTRIN) 800 MG tablet   Oral   Take 1 tablet (800 mg total) by mouth every 8 (eight) hours as needed.   30 tablet   0   . penicillin v potassium (VEETID) 500 MG tablet   Oral   Take 1 tablet (500 mg total) by mouth 4 (four) times daily.   40 tablet   0    Allergies Review of patient's allergies indicates no known  allergies.  History reviewed. No pertinent family history.  Social History Social History  Substance Use Topics  . Smoking status: Current Every Day Smoker  . Smokeless tobacco: None  . Alcohol Use: No   Review of Systems  Constitutional: Negative for fever. Eyes: Negative for visual changes. ENT: Negative for sore throat. Dental pain as above. Cardiovascular: Negative for chest pain. Respiratory: Negative for shortness of breath. Gastrointestinal: Negative for abdominal pain, vomiting and diarrhea. Skin: Negative for rash. Neurological: Negative for headaches, focal weakness or numbness. ____________________________________________  PHYSICAL EXAM:  VITAL SIGNS: ED Triage Vitals  Enc Vitals Group     BP 08/09/15 0954 118/65 mmHg     Pulse Rate 08/09/15 0954 65     Resp 08/09/15 0954 16     Temp 08/09/15 0954 98 F (36.7 C)     Temp Source 08/09/15 0954 Oral     SpO2 08/09/15 0954 100 %     Weight 08/09/15 0954 125 lb (56.7 kg)     Height 08/09/15 0954 6' (1.829 m)     Head Cir --      Peak Flow --      Pain Score 08/09/15 0951 8     Pain Loc --      Pain Edu? --      Excl. in [place]? --  Constitutional: Alert and oriented. Well appearing and in no distress. Head: Normocephalic and atraumatic.      Eyes: Conjunctivae are normal. PERRL. Normal extraocular movements      Ears: Canals clear. TMs intact bilaterally.   Nose: No congestion/rhinorrhea.   Mouth/Throat: Mucous membranes are moist. Uvula is midline and tonsils are flat. Patient with subtle mild swelling to the left lower jaw on examination. There is no obvious fluctuance, pointing, or erythema to the left lower buccal mucosa. No dental fracture or obvious cavity is noted to the left lower molars.   Neck: Supple. No thyromegaly. Hematological/Lymphatic/Immunological: No cervical lymphadenopathy. Cardiovascular: Normal rate, regular rhythm.  Respiratory: Normal respiratory effort. No  wheezes/rales/rhonchi. Musculoskeletal: Nontender with normal range of motion in all extremities.  Neurologic:  Normal gait without ataxia. Normal speech and language. No gross focal neurologic deficits are appreciated. Skin:  Skin is warm, dry and intact. No rash noted. ____________________________________________  PROCEDURES  Pen VK 500 mg PO ____________________________________________  INITIAL IMPRESSION / ASSESSMENT AND PLAN / ED COURSE  Patient with acute dental abscess secondary to dental caries. Patient will be discharged with a prescription for penicillin dose as direct. She is also given a perception for Tessalon Perles for cough suppression. She is advised to daytime Delsym for cough management as needed. She will follow up with one of the dental providers as needed. ____________________________________________  FINAL CLINICAL IMPRESSION(S) / ED DIAGNOSES  Final diagnoses:  Dental abscess  Dental caries  Cough      Melvenia Needles, PA-C 08/09/15 1137  Earleen Newport, MD 08/09/15 1203

## 2015-08-28 ENCOUNTER — Emergency Department
Admission: EM | Admit: 2015-08-28 | Discharge: 2015-08-28 | Disposition: A | Discharge status: Home / Self Care | Payer: Self-pay | Attending: Emergency Medicine | Admitting: Emergency Medicine

## 2015-08-28 ENCOUNTER — Encounter: Payer: Self-pay | Admitting: *Deleted

## 2015-08-28 DIAGNOSIS — A5901 Trichomonal vulvovaginitis: Secondary | ICD-10-CM | POA: Insufficient documentation

## 2015-08-28 DIAGNOSIS — N730 Acute parametritis and pelvic cellulitis: Secondary | ICD-10-CM | POA: Insufficient documentation

## 2015-08-28 DIAGNOSIS — A599 Trichomoniasis, unspecified: Secondary | ICD-10-CM

## 2015-08-28 DIAGNOSIS — F17210 Nicotine dependence, cigarettes, uncomplicated: Secondary | ICD-10-CM | POA: Insufficient documentation

## 2015-08-28 DIAGNOSIS — Z3202 Encounter for pregnancy test, result negative: Secondary | ICD-10-CM | POA: Insufficient documentation

## 2015-08-28 LAB — COMPREHENSIVE METABOLIC PANEL
ALBUMIN: 3.6 g/dL (ref 3.5–5.0)
ALT: 25 U/L (ref 14–54)
AST: 22 U/L (ref 15–41)
Alkaline Phosphatase: 30 U/L — ABNORMAL LOW (ref 38–126)
Anion gap: 4 — ABNORMAL LOW (ref 5–15)
BILIRUBIN TOTAL: 0.5 mg/dL (ref 0.3–1.2)
BUN: 10 mg/dL (ref 6–20)
CO2: 25 mmol/L (ref 22–32)
Calcium: 8.1 mg/dL — ABNORMAL LOW (ref 8.9–10.3)
Chloride: 105 mmol/L (ref 101–111)
Creatinine, Ser: 0.82 mg/dL (ref 0.44–1.00)
GFR calc Af Amer: >60 mL/min (ref >60)
GFR calc non Af Amer: >60 mL/min (ref >60)
GLUCOSE: 85 mg/dL (ref 65–99)
POTASSIUM: 3.7 mmol/L (ref 3.5–5.1)
SODIUM: 134 mmol/L — AB (ref 135–145)
TOTAL PROTEIN: 5.8 g/dL — AB (ref 6.5–8.1)

## 2015-08-28 LAB — URINALYSIS COMPLETE WITH MICROSCOPIC (ARMC ONLY)
BACTERIA UA: NONE SEEN
Bilirubin Urine: NEGATIVE
GLUCOSE, UA: NEGATIVE mg/dL
HGB URINE DIPSTICK: NEGATIVE
Ketones, ur: NEGATIVE mg/dL
LEUKOCYTES UA: NEGATIVE
Nitrite: NEGATIVE
PH: 5 (ref 5.0–8.0)
Protein, ur: NEGATIVE mg/dL
SPECIFIC GRAVITY, URINE: 1.018 (ref 1.005–1.030)

## 2015-08-28 LAB — CBC WITH DIFFERENTIAL/PLATELET
BASOS ABS: 0.1 10*3/uL (ref 0–0.1)
BASOS PCT: 1 %
EOS ABS: 0.3 10*3/uL (ref 0–0.7)
Eosinophils Relative: 2 %
HEMATOCRIT: 38.3 % (ref 35.0–47.0)
HEMOGLOBIN: 12.9 g/dL (ref 12.0–16.0)
Lymphocytes Relative: 25 %
Lymphs Abs: 3.3 10*3/uL (ref 1.0–3.6)
MCH: 30.8 pg (ref 26.0–34.0)
MCHC: 33.7 g/dL (ref 32.0–36.0)
MCV: 91.3 fL (ref 80.0–100.0)
MONO ABS: 0.8 10*3/uL (ref 0.2–0.9)
Monocytes Relative: 6 %
NEUTROS ABS: 8.9 10*3/uL — AB (ref 1.4–6.5)
NEUTROS PCT: 66 %
Platelets: 312 10*3/uL (ref 150–440)
RBC: 4.19 MIL/uL (ref 3.80–5.20)
RDW: 15 % — AB (ref 11.5–14.5)
WBC: 13.3 10*3/uL — ABNORMAL HIGH (ref 3.6–11.0)

## 2015-08-28 LAB — CHLAMYDIA/NGC RT PCR (ARMC ONLY)
Chlamydia Tr: NOT DETECTED
N gonorrhoeae: NOT DETECTED

## 2015-08-28 LAB — WET PREP, GENITAL
CLUE CELLS WET PREP: NONE SEEN
Sperm: NONE SEEN
YEAST WET PREP: NONE SEEN

## 2015-08-28 LAB — LIPASE, BLOOD: Lipase: 29 U/L (ref 11–51)

## 2015-08-28 LAB — POCT PREGNANCY, URINE: PREG TEST UR: NEGATIVE

## 2015-08-28 MED ORDER — METRONIDAZOLE 500 MG PO TABS: 500.0000 mg | ORAL_TABLET | Freq: Two times a day (BID) | ORAL | Status: DC

## 2015-08-28 MED ORDER — DOXYCYCLINE HYCLATE 100 MG PO CAPS: 100.0000 mg | ORAL_CAPSULE | Freq: Two times a day (BID) | ORAL | Status: DC

## 2015-08-28 MED ORDER — CEFTRIAXONE SODIUM 250 MG IJ SOLR
250.0000 mg | INTRAMUSCULAR | Status: DC
  Administered 2015-08-28: 250 mg via INTRAMUSCULAR
  Filled 2015-08-28 (×2): qty 250

## 2015-08-28 MED ORDER — IBUPROFEN 800 MG PO TABS: 800.0000 mg | ORAL_TABLET | Freq: Three times a day (TID) | ORAL | Status: DC | PRN

## 2015-08-28 MED ORDER — AZITHROMYCIN 500 MG PO TABS
1000.0000 mg | ORAL_TABLET | Freq: Once | ORAL | Status: AC
  Administered 2015-08-28: 1000 mg via ORAL
  Filled 2015-08-28: qty 2

## 2015-08-28 MED ORDER — METRONIDAZOLE 500 MG PO TABS
500.0000 mg | ORAL_TABLET | Freq: Once | ORAL | Status: AC
  Administered 2015-08-28: 500 mg via ORAL
  Filled 2015-08-28: qty 1

## 2015-08-28 MED ORDER — OXYCODONE-ACETAMINOPHEN 5-325 MG PO TABS
2.0000 | ORAL_TABLET | Freq: Once | ORAL | Status: AC
  Administered 2015-08-28: 2 via ORAL
  Filled 2015-08-28: qty 2

## 2015-08-28 NOTE — Discharge Instructions (Signed)
Pelvic Inflammatory Disease °Pelvic inflammatory disease (PID) refers to an infection in some or all of the female organs. The infection can be in the uterus, ovaries, fallopian tubes, or the surrounding tissues in the pelvis. PID can cause abdominal or pelvic pain that comes on suddenly (acute pelvic pain). PID is a serious infection because it can lead to lasting (chronic) pelvic pain or the inability to have children (infertility). °CAUSES °This condition is most often caused by an infection that is spread during sexual contact. However, the infection can also be caused by the normal bacteria that are found in the vaginal tissues if these bacteria travel upward into the reproductive organs. PID can also occur following: °· The birth of a baby. °· A miscarriage. °· An abortion. °· Major pelvic surgery. °· The use of an intrauterine device (IUD). °· A sexual assault. °RISK FACTORS °This condition is more likely to develop in women who: °· Are younger than 31 years of age. °· Are sexually active at a young age. °· Use nonbarrier contraception. °· Have multiple sexual partners. °· Have sex with someone who has symptoms of an STD (sexually transmitted disease). °· Use oral contraception. °At times, certain behaviors can also increase the possibility of getting PID, such as: °· Using a vaginal douche. °· Having an IUD in place. °SYMPTOMS °Symptoms of this condition include: °· Abdominal or pelvic pain. °· Fever. °· Chills. °· Abnormal vaginal discharge. °· Abnormal uterine bleeding. °· Unusual pain shortly after the end of a menstrual period. °· Painful urination. °· Pain with sexual intercourse. °· Nausea and vomiting. °DIAGNOSIS °To diagnose this condition, your health care provider will do a physical exam and take your medical history. A pelvic exam typically reveals great tenderness in the uterus and the surrounding pelvic tissues. You may also have tests, such as: °· Lab tests, including a pregnancy test, blood  tests, and urine test. °· Culture tests of the vagina and cervix to check for an STD. °· Ultrasound. °· A laparoscopic procedure to look inside the pelvis. °· Examining vaginal secretions under a microscope. °TREATMENT °Treatment for this condition may involve one or more approaches. °· Antibiotic medicines may be prescribed to be taken by mouth. °· Sexual partners may need to be treated if the infection is caused by an STD. °· For more severe cases, hospitalization may be needed to give antibiotics directly into a vein through an IV tube. °· Surgery may be needed if other treatments do not help, but this is rare. °It may take weeks until you are completely well. If you are diagnosed with PID, you should also be checked for human immunodeficiency virus (HIV). Your health care provider may test you for infection again 3 months after treatment. You should not have unprotected sex. °HOME CARE INSTRUCTIONS °· Take over-the-counter and prescription medicines only as told by your health care provider. °· If you were prescribed an antibiotic medicine, take it as told by your health care provider. Do not stop taking the antibiotic even if you start to feel better. °· Do not have sexual intercourse until treatment is completed or as told by your health care provider. If PID is confirmed, your recent sexual partners will need treatment, especially if you had unprotected sex. °· Keep all follow-up visits as told by your health care provider. This is important. °SEEK MEDICAL CARE IF: °· You have increased or abnormal vaginal discharge. °· Your pain does not improve. °· You vomit. °· You have a fever. °· You   cannot tolerate your medicines.  Your partner has an STD.  You have pain when you urinate. SEEK IMMEDIATE MEDICAL CARE IF:  You have increased abdominal or pelvic pain.  You have chills.  Your symptoms are not better in 72 hours even with treatment.   This information is not intended to replace advice given to  you by your health care provider. Make sure you discuss any questions you have with your health care provider.   Document Released: 05/18/2005 Document Revised: 02/06/2015 Document Reviewed: 06/25/2014 Elsevier Interactive Patient Education 2016 [employer].  Trichomoniasis Trichomoniasis is an infection caused by an organism called Trichomonas. The infection can affect both women and men. In women, the outer female genitalia and the vagina are affected. In men, the penis is mainly affected, but the prostate and other reproductive organs can also be involved. Trichomoniasis is a sexually transmitted infection (STI) and is most often passed to another person through sexual contact.  RISK FACTORS  Having unprotected sexual intercourse.  Having sexual intercourse with an infected partner. SIGNS AND SYMPTOMS  Symptoms of trichomoniasis in women include:  Abnormal gray-green frothy vaginal discharge.  Itching and irritation of the vagina.  Itching and irritation of the area outside the vagina. Symptoms of trichomoniasis in men include:   Penile discharge with or without pain.  Pain during urination. This results from inflammation of the urethra. DIAGNOSIS  Trichomoniasis may be found during a Pap test or physical exam. Your health care provider may use one of the following methods to help diagnose this infection:  Testing the pH of the vagina with a test tape.  Using a vaginal swab test that checks for the Trichomonas organism. A test is available that provides results within a few minutes.  Examining a urine sample.  Testing vaginal secretions. Your health care provider may test you for other STIs, including HIV. TREATMENT   You may be given medicine to fight the infection. Women should inform their health care provider if they could be or are pregnant. Some medicines used to treat the infection should not be taken during pregnancy.  Your health care provider may recommend  over-the-counter medicines or creams to decrease itching or irritation.  Your sexual partner will need to be treated if infected.  Your health care provider may test you for infection again 3 months after treatment. HOME CARE INSTRUCTIONS   Take medicines only as directed by your health care provider.  Take over-the-counter medicine for itching or irritation as directed by your health care provider.  Do not have sexual intercourse while you have the infection.  Women should not douche or wear tampons while they have the infection.  Discuss your infection with your partner. Your partner may have gotten the infection from you, or you may have gotten it from your partner.  Have your sex partner get examined and treated if necessary.  Practice safe, informed, and protected sex.  See your health care provider for other STI testing. SEEK MEDICAL CARE IF:   You still have symptoms after you finish your medicine.  You develop abdominal pain.  You have pain when you urinate.  You have bleeding after sexual intercourse.  You develop a rash.  Your medicine makes you sick or makes you throw up (vomit). MAKE SURE YOU:  Understand these instructions.  Will watch your condition.  Will get help right away if you are not doing well or get worse.   This information is not intended to replace advice given to  you by your health care provider. Make sure you discuss any questions you have with your health care provider.   Document Released: 11/11/2000 Document Revised: 06/08/2014 Document Reviewed: 02/27/2013 Elsevier Interactive Patient Education [employer].

## 2015-08-28 NOTE — ED Notes (Signed)
Pt complains of abdominal pain below the umbilicus for 3 days , pt denies nausea and vomiting

## 2015-08-28 NOTE — ED Notes (Signed)
Pt states she has been having sharp pain from her belly button that shoots down into pelvic area.  She reports having been told in the past that she has a fibroid present.

## 2015-08-28 NOTE — ED Provider Notes (Signed)
[facility] Emergency Department Provider Note     Time seen: ----------------------------------------- 8:14 AM on 08/28/2015 -----------------------------------------    I have reviewed the triage vital signs and the nursing notes.   HISTORY  Chief Complaint Abdominal Pain    HPI Andrea Stephens is a 30 y.o. female who presents ER for abdominal pain below the umbilicus for 3 days. She denies fevers, chills, chest pain, shortness of breath, nausea vomiting or diarrhea. Last nausea psych was 4 days ago, she reports she had fibroid tumor in the past and she thinks this was causing her pain. Denies any other complaints.   History reviewed. No pertinent past medical history.  There are no active problems to display for this patient.   History reviewed. No pertinent past surgical history.  Allergies Review of patient's allergies indicates no known allergies.  Social History Social History  Substance Use Topics  . Smoking status: Current Every Day Smoker -- 0.50 packs/day    Types: Cigarettes  . Smokeless tobacco: None  . Alcohol Use: No    Review of Systems Constitutional: Negative for fever. Eyes: Negative for visual changes. ENT: Negative for sore throat. Cardiovascular: Negative for chest pain. Respiratory: Negative for shortness of breath. Gastrointestinal: Positive for lower abdominal pain, negative for vomiting or diarrhea Genitourinary: Negative for dysuria. Musculoskeletal: Negative for back pain. Skin: Negative for rash. Neurological: Negative for headaches, focal weakness or numbness.  10-point ROS otherwise negative.  ____________________________________________   PHYSICAL EXAM:  VITAL SIGNS: ED Triage Vitals  Enc Vitals Group     BP --      Pulse --      Resp --      Temp --      Temp src --      SpO2 --      Weight --      Height --      Head Cir --      Peak Flow --      Pain Score 08/28/15 0810 8     Pain  Loc --      Pain Edu? --      Excl. in [place]? --     Constitutional: Alert and oriented. Well appearing and in no distress. Eyes: Conjunctivae are normal. PERRL. Normal extraocular movements. ENT   Head: Normocephalic and atraumatic.   Nose: No congestion/rhinnorhea.   Mouth/Throat: Mucous membranes are moist.   Neck: No stridor. Cardiovascular: Normal rate, regular rhythm. Normal and symmetric distal pulses are present in all extremities. No murmurs, rubs, or gallops. Respiratory: Normal respiratory effort without tachypnea nor retractions. Breath sounds are clear and equal bilaterally. No wheezes/rales/rhonchi. Gastrointestinal: Mild suprapubic tenderness, no rebound or guarding. Normal bowel sounds. Genitourinary: Thick white yellow vaginal discharge with cervical motion tenderness, tenderness on exam Musculoskeletal: Nontender with normal range of motion in all extremities. No joint effusions.  No lower extremity tenderness nor edema. Neurologic:  Normal speech and language. No gross focal neurologic deficits are appreciated. Speech is normal. No gait instability. Skin:  Skin is warm, dry and intact. No rash noted. Psychiatric: Mood and affect are normal. Speech and behavior are normal. Patient exhibits appropriate insight and judgment. ____________________________________________  ED COURSE:  Pertinent labs & imaging results that were available during my care of the patient were reviewed by me and considered in my medical decision making (see chart for details). Patient with vague lower abdominal pain, I will check basic labs, urine and reevaluate. ____________________________________________    LABS (pertinent  positives/negatives)  Labs Reviewed  WET PREP, GENITAL - Abnormal; Notable for the following:    Trich, Wet Prep PRESENT (*)    WBC, Wet Prep HPF POC MANY (*)    All other components within normal limits  CBC WITH DIFFERENTIAL/PLATELET - Abnormal; Notable for  the following:    WBC 13.3 (*)    RDW 15.0 (*)    Neutro Abs 8.9 (*)    All other components within normal limits  COMPREHENSIVE METABOLIC PANEL - Abnormal; Notable for the following:    Sodium 134 (*)    Calcium 8.1 (*)    Total Protein 5.8 (*)    Alkaline Phosphatase 30 (*)    Anion gap 4 (*)    All other components within normal limits  CHLAMYDIA/NGC RT PCR ([facility] ONLY)  LIPASE, BLOOD  URINALYSIS COMPLETEWITH MICROSCOPIC ([facility] ONLY)  POC URINE PREG, ED   ____________________________________________  FINAL ASSESSMENT AND PLAN  Abdominal pain, Trichomonas, PID  Plan: Patient with labs as dictated above. Patient clinically with pelvic inflammatory disease. She tested positive for Trichomonas, pelvic exam was indicative of STD. She was given Rocephin and Zithromax here, as well as Flagyl and will be discharged with Flagyl, Motrin and doxycycline.  Earleen Newport, MD   Earleen Newport, MD 08/28/15 [phone]

## 2016-02-10 ENCOUNTER — Other Ambulatory Visit: Payer: Self-pay

## 2016-02-10 DIAGNOSIS — N63 Unspecified lump in unspecified breast: Secondary | ICD-10-CM

## 2016-02-17 ENCOUNTER — Encounter: Payer: Self-pay | Admitting: Osteopathic Medicine

## 2016-02-17 ENCOUNTER — Ambulatory Visit (INDEPENDENT_AMBULATORY_CARE_PROVIDER_SITE_OTHER): Payer: BLUE CROSS/BLUE SHIELD | Admitting: Osteopathic Medicine

## 2016-02-17 VITALS — BP 116/75 | HR 71 | Ht 73.0 in | Wt 142.0 lb

## 2016-02-17 DIAGNOSIS — Z87898 Personal history of other specified conditions: Secondary | ICD-10-CM | POA: Insufficient documentation

## 2016-02-17 DIAGNOSIS — F419 Anxiety disorder, unspecified: Secondary | ICD-10-CM | POA: Diagnosis not present

## 2016-02-17 DIAGNOSIS — N63 Unspecified lump in unspecified breast: Secondary | ICD-10-CM | POA: Insufficient documentation

## 2016-02-17 DIAGNOSIS — Z8669 Personal history of other diseases of the nervous system and sense organs: Secondary | ICD-10-CM | POA: Diagnosis not present

## 2016-02-17 DIAGNOSIS — T43225A Adverse effect of selective serotonin reuptake inhibitors, initial encounter: Secondary | ICD-10-CM | POA: Insufficient documentation

## 2016-02-17 DIAGNOSIS — F319 Bipolar disorder, unspecified: Secondary | ICD-10-CM

## 2016-02-17 DIAGNOSIS — Z113 Encounter for screening for infections with a predominantly sexual mode of transmission: Secondary | ICD-10-CM

## 2016-02-17 DIAGNOSIS — T43225D Adverse effect of selective serotonin reuptake inhibitors, subsequent encounter: Secondary | ICD-10-CM

## 2016-02-17 LAB — CBC WITH DIFFERENTIAL/PLATELET
BASOS PCT: 0 %
Basophils Absolute: 0 cells/uL (ref 0–200)
Eosinophils Absolute: 850 cells/uL — ABNORMAL HIGH (ref 15–500)
Eosinophils Relative: 10 %
HCT: 35.4 % (ref 35.0–45.0)
Hemoglobin: 11.8 g/dL (ref 11.7–15.5)
LYMPHS PCT: 25 %
Lymphs Abs: 2125 cells/uL (ref 850–3900)
MCH: 30.2 pg (ref 27.0–33.0)
MCHC: 33.3 g/dL (ref 32.0–36.0)
MCV: 90.5 fL (ref 80.0–100.0)
MONO ABS: 595 {cells}/uL (ref 200–950)
MONOS PCT: 7 %
MPV: 9.1 fL (ref 7.5–12.5)
NEUTROS ABS: 4930 {cells}/uL (ref 1500–7800)
Neutrophils Relative %: 58 %
PLATELETS: 343 10*3/uL (ref 140–400)
RBC: 3.91 MIL/uL (ref 3.80–5.10)
RDW: 13.3 % (ref 11.0–15.0)
WBC: 8.5 10*3/uL (ref 3.8–10.8)

## 2016-02-17 LAB — TSH: TSH: 0.85 m[IU]/L

## 2016-02-17 LAB — HEPATITIS B SURFACE ANTIGEN: HEP B S AG: NEGATIVE

## 2016-02-17 LAB — HEPATITIS B CORE ANTIBODY, TOTAL: HEP B C TOTAL AB: NONREACTIVE

## 2016-02-17 MED ORDER — QUETIAPINE FUMARATE 100 MG PO TABS: ORAL_TABLET | ORAL | 0 refills | Status: DC

## 2016-02-17 NOTE — Patient Instructions (Addendum)
Plan:  New medication, Seroquel, for presumed diagnosis of Bipolar, with referral in process to Psychiatry for confirmation of diagnosis and assistance with medication management.   Any worsening of symptoms or other concerns, especially thoughts of hurting yourself or others, please seek care immediately! Call 911/go to ER.   See below for information for [facility], this facility offers emergent evaluations, admissions if necessary, as well as outpatient treatments.   Breast lump most likely due to fibrocystic breast tissue changes which can be common in menstruating women. Birth control pills are an option for women with severe symptoms.   For DOT forms, prior to filling these out, I will need to review medical records from your past evaluation and treatment for seizures.   You have been asked to complete a release today for [place] to obtain these records.   We will talk more about filling out the form at your visit next week.   Please call if any questions or problems! If you are having urgent concerns, please seek help ASAP, do not wait until your next appointment!     [facility] [address] [place], [place] [address]  Phone: [phone] Toll Free: [phone] Fax: [phone]

## 2016-02-17 NOTE — Progress Notes (Signed)
HPI: Andrea Stephens is a 30 y.o. female  who presents to [facility] today, 02/17/16,  for chief complaint of:  Chief Complaint  Patient presents with  . Establish Care   Breast . Context: felt in the shower on self-exam . Location: R side  . Quality: sore lump, gone now . Timing: felt last week . Assoc signs/symptoms: LMP Day 1- 02/11/16 (1 week ago)   Anxiety: Psych problems in the past - poor historian. Was previously treated by [facility]. Was on Ativan for a few years ago. Hist bipolar - Zoloft caused suicidal thoughts. Hx psychiatric hospitalization. Wellbutrin on discharge. Now having severe anxiety, awake most of the night with racing thoughts and not feeling tired, has been going on several weeks. Wife corroborates this. Some high-energy periods, risky behavior in the past.   DoT Paperwork:  History of seizures, never on medications, pt reports no seizure several years.    Patient is accompanied by wife who assists with history-taking.   Past medical, surgical, social and family history reviewed: History reviewed. No pertinent past medical history. History reviewed. No pertinent surgical history. Social History  Substance Use Topics  . Smoking status: Current Every Day Smoker    Packs/day: 0.50    Types: Cigarettes  . Smokeless tobacco: Not on file  . Alcohol use No   No family history on file.   Current medication list and allergy/intolerance information reviewed:   No current outpatient prescriptions on file.   No current facility-administered medications for this visit.    No Known Allergies    Review of Systems:  Constitutional:  No  fever, no chills, No recent illness, No unintentional weight changes. No significant fatigue.   HEENT: + occasional headache, no vision change, no hearing change, No sore throat, No  sinus pressure  Cardiac: + chest pain w/ severe anxiety, no CP on exertion/exercise, No  pressure, +  palpitations w/ severe anxiety, No  Orthopnea, No dizziness  Respiratory:  No  shortness of breath. No  Cough  Gastrointestinal: No  abdominal pain, No  nausea, No  vomiting,  No  blood in stool, No  diarrhea, No  constipation   Musculoskeletal: No new myalgia/arthralgia  Genitourinary: No  incontinence, No  abnormal genital bleeding, No abnormal genital discharge  Skin: No  Rash, No other wounds/concerning lesions   Hem/Onc: No  easy bruising/bleeding, No  abnormal lymph node, + breast lump as per HPI  Endocrine: No cold intolerance,  No heat intolerance. No polyuria/polydipsia/polyphagia   Neurologic: No  weakness, No  dizziness, No  slurred speech/focal weakness/facial droop  Psychiatric: No  concerns with depression, +concerns with anxiety, +sleep problems, No mood problems  Exam:  BP 116/75   Pulse 71   Ht 6\' 1"  (1.854 m)   Wt 142 lb (64.4 kg)   BMI 18.73 kg/m   Constitutional: VS see above. General Appearance: alert, well-developed, well-nourished, NAD  Eyes: Normal lids and conjunctive, non-icteric sclera  Ears, Nose, Mouth, Throat: MMM, Normal external inspection ears/nares/mouth/lips/gums.   Neck: No masses, trachea midline. No thyroid enlargement. No tenderness/mass appreciated. No lymphadenopathy  Respiratory: Normal respiratory effort. no wheeze, no rhonchi, no rales  Cardiovascular: S1/S2 normal, no murmur, no rub/gallop auscultated. RRR. No lower extremity edema.   Musculoskeletal: Gait normal. No clubbing/cyanosis of digits.   Neurological:  Normal balance/coordination. No tremor.   Skin: warm, dry, intact. No rash/ulcer. No concerning nevi or subq nodules on limited exam.    Psychiatric:  Fair judgment/insight. Normal mood and affect. Oriented x3. No thought disorder. No SI/HI.   BREAST: No rashes/skin changes, normal fibrous breast tissue, no masses or tenderness, normal nipple without discharge, normal axilla     ASSESSMENT/PLAN:   (+)Mood D/o  screening and pt reports Hx bipolar, hx worsening symptoms on SSRI and no improvement on Welbutrin though sounds like probably inadequate trial of this Rx. Records needed, refer psych, start Seroquel  Breast - no concerns for abn, exam nml, no FH, monitor  DoT - need records, will consider filling these out at next visit vs refer to occ health  Anxiety - Plan: Ambulatory referral to Psychiatry, CBC with Differential/Platelet, COMPLETE METABOLIC PANEL WITH GFR, TSH  Bipolar I disorder ([place]) - Plan: QUEtiapine (SEROQUEL) 100 MG tablet, Ambulatory referral to Psychiatry, CBC with Differential/Platelet, COMPLETE METABOLIC PANEL WITH GFR, TSH  Breast lump in female - normal exam, likely fibrocystic change  History of seizure - need records  Routine screening for STI (sexually transmitted infection) - Plan: HIV antibody, Hepatitis B core antibody, total, Hepatitis B surface antigen, RPR  Adverse reaction to SSRI (selective serotonin reuptake inhibitor), subsequent encounter - Hx suicidal ideation on Zoloft, ?inappropriate tx in bipolar patient?    Patient Instructions  Plan:  New medication, Seroquel, for presumed diagnosis of Bipolar, with referral in process to Psychiatry for confirmation of diagnosis and assistance with medication management.   Any worsening of symptoms or other concerns, especially thoughts of hurting yourself or others, please seek care immediately! Call 911/go to ER.   See below for information for [facility], this facility offers emergent evaluations, admissions if necessary, as well as outpatient treatments.   Breast lump most likely due to fibrocystic breast tissue changes which can be common in menstruating women. Birth control pills are an option for women with severe symptoms.   For DOT forms, prior to filling these out, I will need to review medical records from your past evaluation and treatment for seizures.   You have been asked to  complete a release today for [place] to obtain these records.   We will talk more about filling out the form at your visit next week.   Please call if any questions or problems! If you are having urgent concerns, please seek help ASAP, do not wait until your next appointment!     [facility] [address] [place], [place] [address]  Phone: [phone] Toll Free: [phone] Fax: [phone]    Visit summary with medication list and pertinent instructions was printed for patient to review. All questions at time of visit were answered - patient instructed to contact office with any additional concerns. ER/RTC precautions were reviewed with the patient. Follow-up plan: Return in about 1 week (around 02/24/2016), or sooner if needed, for ANNUAL PHYSICAL/PAP w/ recheck on new medications, fill out paperwork once records reviewed.

## 2016-02-18 LAB — HIV ANTIBODY (ROUTINE TESTING W REFLEX): HIV: NONREACTIVE

## 2016-02-18 LAB — COMPLETE METABOLIC PANEL WITH GFR
ALT: 8 U/L (ref 6–29)
AST: 11 U/L (ref 10–30)
Albumin: 3.8 g/dL (ref 3.6–5.1)
Alkaline Phosphatase: 31 U/L — ABNORMAL LOW (ref 33–115)
BUN: 8 mg/dL (ref 7–25)
CALCIUM: 8.9 mg/dL (ref 8.6–10.2)
CHLORIDE: 110 mmol/L (ref 98–110)
CO2: 24 mmol/L (ref 20–31)
CREATININE: 0.69 mg/dL (ref 0.50–1.10)
Glucose, Bld: 85 mg/dL (ref 65–99)
POTASSIUM: 4 mmol/L (ref 3.5–5.3)
Sodium: 141 mmol/L (ref 135–146)
Total Bilirubin: 0.3 mg/dL (ref 0.2–1.2)
Total Protein: 5.8 g/dL — ABNORMAL LOW (ref 6.1–8.1)

## 2016-02-18 LAB — RPR

## 2016-02-24 ENCOUNTER — Encounter: Payer: Self-pay | Admitting: Osteopathic Medicine

## 2016-02-24 ENCOUNTER — Ambulatory Visit (INDEPENDENT_AMBULATORY_CARE_PROVIDER_SITE_OTHER): Payer: BLUE CROSS/BLUE SHIELD | Admitting: Osteopathic Medicine

## 2016-02-24 ENCOUNTER — Other Ambulatory Visit: Payer: Self-pay | Admitting: Osteopathic Medicine

## 2016-02-24 VITALS — BP 106/70 | HR 77 | Ht 73.0 in | Wt 142.0 lb

## 2016-02-24 DIAGNOSIS — Z87898 Personal history of other specified conditions: Secondary | ICD-10-CM

## 2016-02-24 DIAGNOSIS — F319 Bipolar disorder, unspecified: Secondary | ICD-10-CM | POA: Diagnosis not present

## 2016-02-24 DIAGNOSIS — Z8669 Personal history of other diseases of the nervous system and sense organs: Secondary | ICD-10-CM

## 2016-02-24 DIAGNOSIS — F1911 Other psychoactive substance abuse, in remission: Secondary | ICD-10-CM | POA: Insufficient documentation

## 2016-02-24 NOTE — Assessment & Plan Note (Signed)
As of 02/24/2016, patient states that she has been clean and sober since 12/07/2015. Agree to urine drug screen in the office. DOT paperwork filled out.

## 2016-02-24 NOTE — Patient Instructions (Signed)
You have been given a copy of your DOT paperwork, we have copied this for your chart/file in the office.   Since your vision screen in the office was 20/25, I recommend you see an eye doctor to determine if you need glasses/contacts for driving. They'll need to fill out their portion of the DOT paperwork.   Please let us know if there are any problems getting in to be seen with psychiatry. For now, you can take the Seroquel at one half to one tablet before bedtime. I would bring your DOT paperwork to your psychiatrist as well, see if they will need to fill out the portion regarding mental health.

## 2016-02-24 NOTE — Progress Notes (Signed)
HPI: Andrea Stephens is a 30 y.o. female  who presents to [facility] today, 02/24/16,  for chief complaint of:  Chief Complaint  Patient presents with  . needs paperwork completed  . Follow-up on bipolar/Seroquel    Patient needs paperwork filled out for [employer]. She says that since several years ago after experiencing a "seizure" she has been getting requests every year or so for a physician to complete these papers. She states that at the time she was diagnosed with a seizure.  Per my review of the records, she was treated at [facility] for a syncopal episode in the ED on 09/12/2011 at age 65. Written record is a bit difficult to read. Diagnoses included syncope and hypoglycemia as well as alcohol abuse/acute intoxication patient was discharged home in stable condition. Ethanol 0.097, UDS positive for MDMA and cocaine. At that time, glucose was 47, potassium 3.0, CO2 20, AST elevated at 40, white blood cells elevated at 18.3.   Today, she states that she has been clean and sober for several months. She reports that she is involved in Narcotics Anonymous program and currently living with other recovering attics/alcoholics. She states that she has been clean since 12/07/2015 to present. She agrees to urine drug screen today.  Recently presented to the office with complaints of significant anxiety, screen positive for bipolar and history of adverse reaction to SSRI, started on Seroquel. Patient states that this makes her quite drowsy even at the lower doses. She is still taking only half a tablet at bedtime. The patient works third shift, bedtime for her is about 6:30 in the morning. Worse the medication is helping her mood and her sleep but she is nervous to go up on the dose due to concerns for drowsiness.  Patient is accompanied by wife who assists with history-taking.   Past medical, surgical, social and family  history reviewed: Past Medical History:  Diagnosis Date  . History of bipolar disorder   . History of seizure    No past surgical history on file. Social History  Substance Use Topics  . Smoking status: Current Every Day Smoker    Packs/day: 0.50    Types: Cigarettes  . Smokeless tobacco: Never Used  . Alcohol use No   Family History  Problem Relation Age of Onset  . Cancer Mother   . Depression Mother   . Alcohol abuse Father   . Diabetes Paternal Aunt   . Diabetes Paternal Uncle      Current medication list and allergy/intolerance information reviewed:   Current Outpatient Prescriptions  Medication Sig Dispense Refill  . QUEtiapine (SEROQUEL) 100 MG tablet 1/2 tablet (50 mg) once daily at bedtime on day 1; increase to 1 tablet (100 mg) once daily on day 2, 2 tablets (200 mg) once daily on day 3, 3 tablets (300 mg) once daily on day 4 and continue 3 tablets daily until seen in the office. Maximum dose: 300 mg once daily. 30 tablet 0   No current facility-administered medications for this visit.    No Known Allergies    Review of Systems:  Constitutional:  No  fever, no chills, No recent illnes.   Cardiac: No  chest pain  Respiratory:  No  shortness of breath. No  Cough  Gastrointestinal: No  abdominal pain, No  nausea  Musculoskeletal: No new myalgia/arthralgia  Skin: No  Rash,  Neurologic: No  weakness, No  dizziness  Psychiatric: No  concerns with depression, +concerns with anxiety, No sleep problems, No mood problems  Exam:  BP 106/70   Pulse 77   Ht 6\' 1"  (1.854 m)   Wt 142 lb (64.4 kg)   BMI 18.73 kg/m   Constitutional: VS see above. General Appearance: alert, well-developed, well-nourished, NAD  Neck: No masses, trachea midline.   Respiratory: Normal respiratory effort. no wheeze, no rhonchi, no rales   Cardiovascular: S1/S2 normal, no murmur, no rub/gallop auscultated. RRR.   Psychiatric: Normal judgment/insight. Normal mood and affect.  Oriented x3.      ASSESSMENT/PLAN: Other than mildly abnormal vision screen at 20/25, I see no reason the patient cannot safely drive a vehicle as long as she is able to stay clean/sober. Patient has good support system in place, following a program to stay clean/sober, recognizes consequences of not doing so as this relates to her personally as well as how it relates to safety with driving   History of substance abuse - Plan: Drug Scr Ur, Pain Mgmt, Reflex Conf, CANCELED: Pain Mgmt, Profile 6 Conf w/o mM, U  Bipolar I disorder ([place]) - Needs follow-up with psychiatry, patient and her wife advised to go down to that office today to schedule an appointment rather than waiting for a phone call  History of seizure Records reviewed, see note from 02/24/2016 for full details. I'm not convinced this was a seizure, was syncopal episode due to hypoglycemia and alcohol/drug abuse. Patient has never undergone an EEG, no previous or subsequent seizures, no antiseizure medication.     Patient Instructions  You have been given a copy of your DOT paperwork, we have copied this for your chart/file in the office.   Since your vision screen in the office was 20/25, I recommend you see an eye doctor to determine if you need glasses/contacts for driving. They'll need to fill out their portion of the DOT paperwork.   Please let us know if there are any problems getting in to be seen with psychiatry. For now, you can take the Seroquel at one half to one tablet before bedtime. I would bring your DOT paperwork to your psychiatrist as well, see if they will need to fill out the portion regarding mental health.   Visit summary with medication list and pertinent instructions was printed for patient to review. All questions at time of visit were answered - patient instructed to contact office with any additional concerns. ER/RTC precautions were reviewed with the patient. Follow-up plan: Return in about 6 weeks (around  04/06/2016), or sooner if needed, for ANNUAL PHYSICAL & PAP.  Note: Total time spent 25 minutes, greater than 50% of the visit was spent face-to-face counseling and coordinating care for the following: The primary encounter diagnosis was History of substance abuse. Diagnoses of Bipolar I disorder ([place]) and History of seizure were also pertinent to this visit.Marland Kitchen

## 2016-02-25 LAB — PAIN MGMT, PROFILE 6 CONF W/O MM, U
6 Acetylmorphine: NEGATIVE ng/mL (ref <10)
AMPHETAMINES: NEGATIVE ng/mL (ref <500)
Alcohol Metabolites: NEGATIVE ng/mL (ref <500)
BARBITURATES: NEGATIVE ng/mL (ref <300)
BENZODIAZEPINES: NEGATIVE ng/mL (ref <100)
CREATININE: 127.4 mg/dL (ref >=20.0)
Cocaine Metabolite: NEGATIVE ng/mL (ref <150)
Marijuana Metabolite: NEGATIVE ng/mL (ref <20)
Methadone Metabolite: NEGATIVE ng/mL (ref <100)
OPIATES: NEGATIVE ng/mL (ref <100)
OXIDANT: NEGATIVE ug/mL (ref <200)
Oxycodone: NEGATIVE ng/mL (ref <100)
PH: 7.41 (ref 4.5–9.0)
Phencyclidine: NEGATIVE ng/mL (ref <25)
Please note:: 0

## 2016-03-03 ENCOUNTER — Ambulatory Visit (HOSPITAL_COMMUNITY): Payer: BLUE CROSS/BLUE SHIELD | Admitting: Psychiatry

## 2016-03-04 ENCOUNTER — Other Ambulatory Visit (HOSPITAL_COMMUNITY)
Admission: RE | Admit: 2016-03-04 | Discharge: 2016-03-04 | Disposition: A | Discharge status: Home / Self Care | Payer: BLUE CROSS/BLUE SHIELD | Source: Ambulatory Visit | Attending: Osteopathic Medicine | Admitting: Osteopathic Medicine

## 2016-03-04 ENCOUNTER — Encounter: Payer: Self-pay | Admitting: Osteopathic Medicine

## 2016-03-04 ENCOUNTER — Ambulatory Visit (INDEPENDENT_AMBULATORY_CARE_PROVIDER_SITE_OTHER): Payer: BLUE CROSS/BLUE SHIELD | Admitting: Osteopathic Medicine

## 2016-03-04 VITALS — BP 123/70 | HR 64 | Ht 73.0 in | Wt 142.0 lb

## 2016-03-04 DIAGNOSIS — R102 Pelvic and perineal pain unspecified side: Secondary | ICD-10-CM

## 2016-03-04 DIAGNOSIS — N760 Acute vaginitis: Secondary | ICD-10-CM | POA: Diagnosis present

## 2016-03-04 DIAGNOSIS — Z113 Encounter for screening for infections with a predominantly sexual mode of transmission: Secondary | ICD-10-CM | POA: Diagnosis not present

## 2016-03-04 DIAGNOSIS — Z01419 Encounter for gynecological examination (general) (routine) without abnormal findings: Secondary | ICD-10-CM | POA: Insufficient documentation

## 2016-03-04 DIAGNOSIS — Z0000 Encounter for general adult medical examination without abnormal findings: Secondary | ICD-10-CM

## 2016-03-04 DIAGNOSIS — Z1151 Encounter for screening for human papillomavirus (HPV): Secondary | ICD-10-CM | POA: Insufficient documentation

## 2016-03-04 DIAGNOSIS — F319 Bipolar disorder, unspecified: Secondary | ICD-10-CM | POA: Diagnosis not present

## 2016-03-04 DIAGNOSIS — Z23 Encounter for immunization: Secondary | ICD-10-CM | POA: Diagnosis not present

## 2016-03-04 NOTE — Progress Notes (Signed)
HPI: Andrea Stephens is a 30 y.o. female  who presents to [place] today, 03/04/16,  for chief complaint of:  Chief Complaint  Patient presents with  . Annual Exam  . Gynecologic Exam    See preventive care reviewed below.    Past medical, surgical, social and family history reviewed: Past Medical History:  Diagnosis Date  . History of bipolar disorder   . History of seizure    History reviewed. No pertinent surgical history. Social History  Substance Use Topics  . Smoking status: Current Every Day Smoker    Packs/day: 0.50    Types: Cigarettes  . Smokeless tobacco: Never Used  . Alcohol use No   Family History  Problem Relation Age of Onset  . Cancer Mother   . Depression Mother   . Alcohol abuse Father   . Diabetes Paternal Aunt   . Diabetes Paternal Uncle      Current medication list and allergy/intolerance information reviewed:   Current Outpatient Prescriptions  Medication Sig Dispense Refill  . QUEtiapine (SEROQUEL) 100 MG tablet 1/2 tablet (50 mg) once daily at bedtime on day 1; increase to 1 tablet (100 mg) once daily on day 2, 2 tablets (200 mg) once daily on day 3, 3 tablets (300 mg) once daily on day 4 and continue 3 tablets daily until seen in the office. Maximum dose: 300 mg once daily. 30 tablet 0   No current facility-administered medications for this visit.    No Known Allergies    Review of Systems:  Constitutional:  No  fever, no chills, No recent illness,  HEENT: No  headache,  Cardiac: No  chest pain  Respiratory:  No  shortness of breath.  Gastrointestinal: No  abdominal pain, No  nausea, No  vomiting,  No  blood in stool, No  diarrhea, No  constipation   Musculoskeletal: No new myalgia/arthralgia  Genitourinary: No  incontinence, No  abnormal genital bleeding, No abnormal genital discharge, occasional pelvic pain with periods but also randomly, not too bothersome, normal GI/GU ROS otherwise    Skin: No  Rash, No other wounds/concerning lesions  Psychiatric: No  concerns with depression, No  concerns with anxiety  Exam:  BP 123/70   Pulse 64   Ht _0  (1.854 m)   Wt 142 lb (64.4 kg)   BMI 18.73 kg/m   Constitutional: VS see above. General Appearance: alert, well-developed, well-nourished, NAD  Eyes: Normal lids and conjunctive, non-icteric sclera  Ears, Nose, Mouth, Throat: MMM,  Neck: No masses, trachea midline.  Respiratory: Normal respiratory effort. no wheeze, no rhonchi, no rales  Cardiovascular: S1/S2 normal, no murmur, no rub/gallop auscultated. RRR. No lower extremity edema  Gastrointestinal: Nontender, no masses. No hepatomegaly, no splenomegaly. No hernia appreciated. Bowel sounds normal. Rectal exam deferred.   Musculoskeletal: Gait normal. No clubbing/cyanosis of digits.   Neurological:  Normal balance/coordination. No tremor.   Skin: warm, dry, intact. No rash/ulcer.   Psychiatric: Normal judgment/insight. Normal mood and affect. Oriented x3.  GYN: No lesions/ulcers to external genitalia, normal urethra, normal vaginal mucosa, physiologic discharge, cervix normal without lesions, uterus not enlarged or tender, adnexa no masses but some tender on L, uterus retroverted   Recent Results (from the past 2160 hour(s))  CBC with Differential/Platelet     Status: Abnormal   Collection Time: 02/17/16 11:58 AM  Result Value Ref Range   WBC 8.5 3.8 - 10.8 K/uL   RBC 3.91 3.80 - 5.10 MIL/uL  Hemoglobin 11.8 11.7 - 15.5 g/dL   HCT 35.4 35.0 - 45.0 %   MCV 90.5 80.0 - 100.0 fL   MCH 30.2 27.0 - 33.0 pg   MCHC 33.3 32.0 - 36.0 g/dL   RDW 13.3 11.0 - 15.0 %   Platelets 343 140 - 400 K/uL   MPV 9.1 7.5 - 12.5 fL   Neutro Abs 4,930 1,500 - 7,800 cells/uL   Lymphs Abs 2,125 850 - 3,900 cells/uL   Monocytes Absolute 595 200 - 950 cells/uL   Eosinophils Absolute 850 (H) 15 - 500 cells/uL   Basophils Absolute 0 0 - 200 cells/uL   Neutrophils Relative % 58  %   Lymphocytes Relative 25 %   Monocytes Relative 7 %   Eosinophils Relative 10 %   Basophils Relative 0 %   Smear Review Criteria for review not met   COMPLETE METABOLIC PANEL WITH GFR     Status: Abnormal   Collection Time: 02/17/16 11:58 AM  Result Value Ref Range   Sodium 141 135 - 146 mmol/L   Potassium 4.0 3.5 - 5.3 mmol/L   Chloride 110 98 - 110 mmol/L   CO2 24 20 - 31 mmol/L   Glucose, Bld 85 65 - 99 mg/dL   BUN 8 7 - 25 mg/dL   Creat 0.69 0.50 - 1.10 mg/dL   Total Bilirubin 0.3 0.2 - 1.2 mg/dL   Alkaline Phosphatase 31 (L) 33 - 115 U/L   AST 11 10 - 30 U/L   ALT 8 6 - 29 U/L   Total Protein 5.8 (L) 6.1 - 8.1 g/dL   Albumin 3.8 3.6 - 5.1 g/dL   Calcium 8.9 8.6 - 10.2 mg/dL   GFR, Est African American >89 >=60 mL/min   GFR, Est Non African American >89 >=60 mL/min  TSH     Status: None   Collection Time: 02/17/16 11:58 AM  Result Value Ref Range   TSH 0.85 mIU/L    Comment:   Reference Range   > or = 20 Years  0.40-4.50   Pregnancy Range First trimester  0.26-2.66 Second trimester 0.55-2.73 Third trimester  0.43-2.91     HIV antibody     Status: None   Collection Time: 02/17/16 11:58 AM  Result Value Ref Range   HIV 1&2 Ab, 4th Generation NONREACTIVE NONREACTIVE    Comment:   HIV-1 antigen and HIV-1/HIV-2 antibodies were not detected.  There is no laboratory evidence of HIV infection.   HIV-1/2 Antibody Diff        Not indicated. HIV-1 RNA, Qual TMA          Not indicated.     PLEASE NOTE: This information has been disclosed to you from records whose confidentiality may be protected by state law. If your state requires such protection, then the state law prohibits you from making any further disclosure of the information without the specific written consent of the person to whom it pertains, or as otherwise permitted by law. A general authorization for the release of medical or other information is NOT sufficient for this purpose.   The performance  of this assay has not been clinically validated in patients less than 30 years old.   For additional information please refer to http://education.[facility].com/faq/FAQ106.  (This link is being provided for informational/educational purposes only.)     Hepatitis B core antibody, total     Status: None   Collection Time: 02/17/16 11:58 AM  Result Value Ref Range   Hep  B Core Total Ab NON REACTIVE NON REACTIVE  Hepatitis B surface antigen     Status: None   Collection Time: 02/17/16 11:58 AM  Result Value Ref Range   Hepatitis B Surface Ag NEGATIVE NEGATIVE  RPR     Status: None   Collection Time: 02/17/16 11:58 AM  Result Value Ref Range   RPR Ser Ql NON REAC NON REAC  Pain Mgmt, Profile 6 Conf w/o mM, U     Status: None   Collection Time: 02/24/16  9:42 AM  Result Value Ref Range   Creatinine 127.4 > or = 20.0 mg/dL   pH 7.41 4.5 - 9.0   Oxidant NEGATIVE <200 mcg/mL   Amphetamines NEGATIVE <500 ng/mL   Barbiturates NEGATIVE <300 ng/mL   Benzodiazepines NEGATIVE <100 ng/mL   Marijuana Metabolite NEGATIVE <20 ng/mL   Cocaine Metabolite NEGATIVE <150 ng/mL   Methadone Metabolite NEGATIVE <100 ng/mL   Opiates NEGATIVE <100 ng/mL   Oxycodone NEGATIVE <100 ng/mL   Phencyclidine NEGATIVE <25 ng/mL   Alcohol Metabolites NEGATIVE <500 ng/mL   Please note: .    6 Acetylmorphine NEGATIVE <10 ng/mL    Comment:   This drug testing is for medical treatment only. Analysis was performed as non-forensic testing and these results should be used only by healthcare providers to render diagnosis or treatment, or to monitor progress of medical conditions.   For assistance with interpreting these drug results, please contact a [employer] [facility] Toxicology Specialist: [phone] [place] [phone]), M-F, 8am-6pm EST.        ASSESSMENT/PLAN:   Annual physical exam - Plan: Cytology - PAP  Routine screening for STI (sexually transmitted infection) - Plan: Cytology -  PAP  Bipolar I disorder ([place]) - Missed appointment with psychiatry yesterday due to scheduling confusion, advised reschedule ASAP  Pelvic pain - Mostly associated with periods, offered ultrasound tests, patient declined at this time. 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 STI testing today? - yes  INFECTIOUS DISEASE SCREENING HIV - all adults 15-65 - does not need GC/CT - sexually active - does not need HepC - DOB [birth year] - does not need TB - does not need  DISEASE SCREENING Lipid - does not need DM2 - does not need Osteoporosis - does not need  CANCER SCREENING Cervical - needs Breast - does not need Lung - does not need Colon - does not need  ADULT VACCINATION Influenza - was offered and declined by the patient Td - already has HPV - was not indicated Zoster - was not indicated Pneumonia - was not indicated  OTHER Fall - exercise and Vit D age 70+ - does not need Consider ASA - age 43-59 - does not need   Visit summary with medication list and pertinent instructions was printed for patient to review. All questions at time of visit were answered - patient instructed to contact office with any additional concerns. ER/RTC precautions were reviewed with the patient. Follow-up plan: Return in about 1 year (around 03/04/2017) for [place] - sooner if needed.

## 2016-03-05 LAB — CYTOLOGY - PAP

## 2016-03-09 LAB — CERVICOVAGINAL ANCILLARY ONLY: Candida vaginitis: NEGATIVE

## 2016-03-11 ENCOUNTER — Encounter: Payer: Self-pay | Admitting: Osteopathic Medicine

## 2016-04-01 ENCOUNTER — Ambulatory Visit (INDEPENDENT_AMBULATORY_CARE_PROVIDER_SITE_OTHER): Payer: BLUE CROSS/BLUE SHIELD | Admitting: Osteopathic Medicine

## 2016-04-01 ENCOUNTER — Encounter: Payer: Self-pay | Admitting: Osteopathic Medicine

## 2016-04-01 VITALS — BP 113/71 | HR 76 | Wt 149.0 lb

## 2016-04-01 DIAGNOSIS — A599 Trichomoniasis, unspecified: Secondary | ICD-10-CM | POA: Diagnosis not present

## 2016-04-01 DIAGNOSIS — K047 Periapical abscess without sinus: Secondary | ICD-10-CM

## 2016-04-01 DIAGNOSIS — F319 Bipolar disorder, unspecified: Secondary | ICD-10-CM | POA: Diagnosis not present

## 2016-04-01 MED ORDER — IBUPROFEN 800 MG PO TABS: 800.0000 mg | ORAL_TABLET | Freq: Three times a day (TID) | ORAL | 2 refills | Status: DC | PRN

## 2016-04-01 MED ORDER — QUETIAPINE FUMARATE 100 MG PO TABS: 100.0000 mg | ORAL_TABLET | Freq: Every day | ORAL | 1 refills | Status: DC

## 2016-04-01 MED ORDER — METRONIDAZOLE 500 MG PO TABS: 2000.0000 mg | ORAL_TABLET | Freq: Once | ORAL | 1 refills | Status: AC

## 2016-04-01 MED ORDER — AMOXICILLIN 875 MG PO TABS: 875.0000 mg | ORAL_TABLET | Freq: Two times a day (BID) | ORAL | 0 refills | Status: DC

## 2016-04-01 NOTE — Progress Notes (Signed)
HPI: Andrea Stephens is a 30 y.o. female  who presents to [place] today, 04/01/16,  for chief complaint of:  Chief Complaint  Patient presents with  . Follow-up    MEDICATION    Mood disorder: Doing well on Seroquel at 100 mg daily at bedtime. Feels she is doing so well on this medication she does not require psychiatric follow-up and asks if I can just continue this medication at current dose. No mood swings. Patient is staying off of drugs and alcohol.  Dental problem: Swelling in face/gums on lower left side, reports some pain in molar. Needs to get into the dentist, no fever, no drainage.  Trichomonas: We had encouraged contact information for patient in terms of phone number as well as address. Patient was informed of diagnosis. Partner has no medication allergies.  Past medical, surgical, social and family history reviewed: Past Medical History:  Diagnosis Date  . History of bipolar disorder   . History of seizure    No past surgical history on file. Social History  Substance Use Topics  . Smoking status: Current Every Day Smoker    Packs/day: 0.50    Types: Cigarettes  . Smokeless tobacco: Never Used  . Alcohol use No   Family History  Problem Relation Age of Onset  . Cancer Mother   . Depression Mother   . Alcohol abuse Father   . Diabetes Paternal Aunt   . Diabetes Paternal Uncle      Current medication list and allergy/intolerance information reviewed:   No current outpatient prescriptions on file prior to visit.   No current facility-administered medications on file prior to visit.    No Known Allergies    Review of Systems:  Constitutional: No recent illness  HEENT: No  headache, no vision change, +tooth problem as per HPI  Cardiac: No  chest pain  Respiratory:  No  shortness of breath. No  Cough  Gastrointestinal: No  abdominal pain, no change on bowel habits  Musculoskeletal: No new  myalgia/arthralgia  Skin: No  Rash  Psychiatric: No  concerns with depression, No  concerns with anxiety  Exam:  BP 113/71   Pulse 76   Wt 149 lb (67.6 kg)   BMI 19.66 kg/m   Constitutional: VS see above. General Appearance: alert, well-developed, well-nourished, NAD  Eyes: Normal lids and conjunctive, non-icteric sclera  Ears, Nose, Mouth, Throat: MMM, Normal external inspection ears/nares/mouth/lips/gums. Significant dental caries on molar of lower left side, no drainage, no erythema, positive tenderness  Neck: No masses, trachea midline.   Respiratory: Normal respiratory effort.  Musculoskeletal: Gait normal. Symmetric and independent movement of all extremities  Neurological: Normal balance/coordination. No tremor.  Skin: warm, dry, intact.   Psychiatric: Normal judgment/insight. Normal mood and affect. Oriented x3. No thought disorder.     ASSESSMENT/PLAN:   Bipolar I disorder ([place]) - Okay to continue mood stabilizer indefinitely, patient advised if symptoms worsen/change me know, follow-up in office, will reconsider psych referral - Plan: QUEtiapine (SEROQUEL) 100 MG tablet, DISCONTINUED: QUEtiapine (SEROQUEL) 100 MG tablet  Trichomonas infection - Refill given on medication for presumptive treatment of partner, side effects discussed, patient reports that her partner has no known allergies. - Plan: metroNIDAZOLE (FLAGYL) 500 MG tablet  Dental infection - No abscess noted, needs to get in to see dentist, I suspect tooth may need to be pulled. - Plan: ibuprofen (ADVIL,MOTRIN) 800 MG tablet, amoxicillin (AMOXIL) 875 MG tablet, DISCONTINUED: amoxicillin (AMOXIL) 875 MG tablet  Visit summary with medication list and pertinent instructions was printed for patient to review. All questions at time of visit were answered - patient instructed to contact office with any additional concerns. ER/RTC precautions were reviewed with the patient. Follow-up plan: Return in about 6  months (around 09/29/2016) for [place].

## 2016-11-20 ENCOUNTER — Encounter: Payer: Self-pay | Admitting: Osteopathic Medicine

## 2016-11-20 ENCOUNTER — Ambulatory Visit (INDEPENDENT_AMBULATORY_CARE_PROVIDER_SITE_OTHER): Payer: BLUE CROSS/BLUE SHIELD | Admitting: Osteopathic Medicine

## 2016-11-20 VITALS — BP 100/66 | HR 65 | Ht 73.0 in | Wt 146.4 lb

## 2016-11-20 DIAGNOSIS — F319 Bipolar disorder, unspecified: Secondary | ICD-10-CM

## 2016-11-20 DIAGNOSIS — Z008 Encounter for other general examination: Secondary | ICD-10-CM

## 2016-11-20 DIAGNOSIS — Z0189 Encounter for other specified special examinations: Secondary | ICD-10-CM

## 2016-11-20 DIAGNOSIS — K047 Periapical abscess without sinus: Secondary | ICD-10-CM

## 2016-11-20 LAB — COMPLETE METABOLIC PANEL WITH GFR
ALT: 10 U/L (ref 6–29)
AST: 15 U/L (ref 10–30)
Albumin: 4.5 g/dL (ref 3.6–5.1)
Alkaline Phosphatase: 31 U/L — ABNORMAL LOW (ref 33–115)
BUN: 12 mg/dL (ref 7–25)
CALCIUM: 9.6 mg/dL (ref 8.6–10.2)
CHLORIDE: 107 mmol/L (ref 98–110)
CO2: 21 mmol/L (ref 20–31)
Creat: 0.76 mg/dL (ref 0.50–1.10)
GFR, Est Non African American: >89 mL/min (ref >=60)
Glucose, Bld: 83 mg/dL (ref 65–99)
POTASSIUM: 4 mmol/L (ref 3.5–5.3)
Sodium: 138 mmol/L (ref 135–146)
Total Bilirubin: 0.4 mg/dL (ref 0.2–1.2)
Total Protein: 7.1 g/dL (ref 6.1–8.1)

## 2016-11-20 LAB — LIPID PANEL
CHOL/HDL RATIO: 2 ratio (ref <5.0)
CHOLESTEROL: 132 mg/dL (ref <200)
HDL: 66 mg/dL (ref >50)
LDL CALC: 58 mg/dL (ref <100)
TRIGLYCERIDES: 42 mg/dL (ref <150)
VLDL: 8 mg/dL (ref <30)

## 2016-11-20 MED ORDER — QUETIAPINE FUMARATE 100 MG PO TABS: 100.0000 mg | ORAL_TABLET | Freq: Every day | ORAL | 1 refills | Status: DC

## 2016-11-20 MED ORDER — AMOXICILLIN 875 MG PO TABS: 875.0000 mg | ORAL_TABLET | Freq: Two times a day (BID) | ORAL | 0 refills | Status: DC

## 2016-11-20 MED ORDER — IBUPROFEN 800 MG PO TABS: 800.0000 mg | ORAL_TABLET | Freq: Three times a day (TID) | ORAL | 2 refills | Status: DC | PRN

## 2016-11-20 NOTE — Progress Notes (Signed)
HPI: Andrea Stephens is a 31 y.o. female  who presents to [place] today, 11/20/16,  for chief complaint of:  Chief Complaint  Patient presents with  . Medication Refill    Bipolar: Doing well on low-dose Seroquel, would like to continue this medication. No excessive sedation, no significant weight gain.  Dental problem: Still has not gotten in to see dentist, tooth has started hurting again pretty significantly lately. [facility] refill on amoxicillin and ibuprofen.  Encounter for biometric screening: Form requires lipid panel and glucose   Past medical history, surgical history, social history and family history reviewed.  Patient Active Problem List   Diagnosis Date Noted  . History of substance abuse 02/24/2016  . Anxiety 02/17/2016  . Bipolar I disorder ([place]) 02/17/2016  . Breast lump in female 02/17/2016  . History of seizure 02/17/2016  . Routine screening for STI (sexually transmitted infection) 02/17/2016  . Adverse reaction to SSRI (selective serotonin reuptake inhibitor) 02/17/2016    Current medication list and allergy/intolerance information reviewed.   Current Outpatient Prescriptions on File Prior to Visit  Medication Sig Dispense Refill  . amoxicillin (AMOXIL) 875 MG tablet Take 1 tablet (875 mg total) by mouth 2 (two) times daily. 20 tablet 0  . ibuprofen (ADVIL,MOTRIN) 800 MG tablet Take 1 tablet (800 mg total) by mouth every 8 (eight) hours as needed. 60 tablet 2  . QUEtiapine (SEROQUEL) 100 MG tablet Take 1 tablet (100 mg total) by mouth at bedtime. 90 tablet 1   No current facility-administered medications on file prior to visit.    No Known Allergies    Review of Systems:  Constitutional: No recent illness  HEENT: +dental pain as per HPI  Cardiac: No  chest pain  Respiratory:  No  shortness of breath.  Psychiatric: No  concerns with depression, No  concerns with anxiety  Exam:  BP 100/66   Pulse 65   Ht 6'  1" (1.854 m)   Wt 146 lb 6.4 oz (66.4 kg)   SpO2 100%   BMI 19.32 kg/m   Constitutional: VS see above. General Appearance: alert, well-developed, well-nourished, NAD  Eyes: Normal lids and conjunctive, non-icteric sclera  Ears, Nose, Mouth, Throat: MMM, Normal external inspection ears/nares/mouth/lips/gums. Lower left molar significant dental caries, mild gingivitis appearance, no abscess.  Neck: No masses, trachea midline.   Respiratory: Normal respiratory effort. no wheeze, no rhonchi, no rales  Cardiovascular: S1/S2 normal, no murmur, no rub/gallop auscultated. RRR.   Musculoskeletal: Gait normal. Symmetric and independent movement of all extremities  Neurological: Normal balance/coordination. No tremor.  Skin: warm, dry, intact.   Psychiatric: Normal judgment/insight. Normal mood and affect. Oriented x3.    ASSESSMENT/PLAN:   Bipolar I disorder ([place]) - Okay to continue mood stabilizer indefinitely, patient advised if symptoms worsen/change me know, follow-up in office, will reconsider psych referral - Plan: COMPLETE METABOLIC PANEL WITH GFR, QUEtiapine (SEROQUEL) 100 MG tablet  Encounter for biometric screening - Plan: COMPLETE METABOLIC PANEL WITH GFR, Lipid panel  Dental infection - No abscess noted, needs to get in to see dentist, looks to me like tooth will need to be pulled  - Plan: amoxicillin (AMOXIL) 875 MG tablet, ibuprofen (ADVIL,MOTRIN) 800 MG tablet    Patient Instructions  Form will be ready for pickup end of Monday Get to a dentist!     Follow-up plan: Return in about 6 months (around 05/22/2017) for refill medications, ANNUAL PHYSICAL, sooner if needed.  Visit summary with medication list  and pertinent instructions was printed for patient to review, alert [place] if any changes needed. All questions at time of visit were answered - patient instructed to contact office with any additional concerns. ER/RTC precautions were reviewed with the patient and  understanding verbalized.

## 2016-11-20 NOTE — Patient Instructions (Signed)
Form will be ready for pickup end of Monday Get to a dentist!

## 2017-02-10 NOTE — ED Notes (Signed)
Pt called from lobby with no response. 

## 2017-02-11 ENCOUNTER — Emergency Department (HOSPITAL_COMMUNITY)
Admission: EM | Admit: 2017-02-11 | Discharge: 2017-02-11 | Discharge status: Against Medical Advice | Payer: BLUE CROSS/BLUE SHIELD

## 2017-02-11 NOTE — ED Notes (Signed)
Called  No response from lobby 

## 2017-06-23 ENCOUNTER — Ambulatory Visit (INDEPENDENT_AMBULATORY_CARE_PROVIDER_SITE_OTHER): Payer: 59 | Admitting: Osteopathic Medicine

## 2017-06-23 ENCOUNTER — Encounter: Payer: Self-pay | Admitting: Osteopathic Medicine

## 2017-06-23 VITALS — BP 110/68 | HR 66 | Wt 155.1 lb

## 2017-06-23 DIAGNOSIS — F319 Bipolar disorder, unspecified: Secondary | ICD-10-CM | POA: Diagnosis not present

## 2017-06-23 MED ORDER — QUETIAPINE FUMARATE 100 MG PO TABS: 100.0000 mg | ORAL_TABLET | Freq: Every day | ORAL | 3 refills | Status: DC

## 2017-06-23 NOTE — Progress Notes (Signed)
HPI: Andrea Stephens is a 32 y.o. female  who presents to [place] today, 06/23/17,  for chief complaint of:  Medication refills - bipolar f/u   Bipolar: Doing well on low-dose Seroquel, would like to continue this medication. No excessive sedation, no significant weight gain. Meeting goals for mood, sleep, work, relationships.     Past medical history, surgical history, social history and family history reviewed.  Patient Active Problem List   Diagnosis Date Noted  . Dental infection 11/20/2016  . History of substance abuse 02/24/2016  . Anxiety 02/17/2016  . Bipolar I disorder ([place]) 02/17/2016  . Breast lump in female 02/17/2016  . History of seizure 02/17/2016  . Routine screening for STI (sexually transmitted infection) 02/17/2016  . Adverse reaction to SSRI (selective serotonin reuptake inhibitor) 02/17/2016    Current medication list and allergy/intolerance information reviewed.   Current Outpatient Medications on File Prior to Visit  Medication Sig Dispense Refill  . amoxicillin (AMOXIL) 875 MG tablet Take 1 tablet (875 mg total) by mouth 2 (two) times daily. 20 tablet 0  . ibuprofen (ADVIL,MOTRIN) 800 MG tablet Take 1 tablet (800 mg total) by mouth every 8 (eight) hours as needed. 60 tablet 2  . QUEtiapine (SEROQUEL) 100 MG tablet Take 1 tablet (100 mg total) by mouth at bedtime. 90 tablet 1   No current facility-administered medications on file prior to visit.    No Known Allergies    Review of Systems:  Constitutional: No recent illness  Cardiac: No  chest pain  Respiratory:  No  shortness of breath.  Psychiatric: No  concerns with depression, No  concerns with anxiety  Exam:  BP 110/68   Pulse 66   Wt 155 lb 1.3 oz (70.3 kg)   BMI 20.46 kg/m   Constitutional: VS see above. General Appearance: alert, well-developed, well-nourished, NAD  Neck: No masses, trachea midline.   Respiratory: Normal respiratory effort.  no wheeze, no rhonchi, no rales  Cardiovascular: S1/S2 normal, no murmur, no rub/gallop auscultated. RRR.   Musculoskeletal: Gait normal. Symmetric and independent movement of all extremities  Neurological: Normal balance/coordination. No tremor.  Skin: warm, dry, intact.   Psychiatric: Normal judgment/insight. Normal mood and affect. Oriented x3.    ASSESSMENT/PLAN:   Bipolar I disorder ([place]) - Okay to continue mood stabilizer indefinitely, patient advised if symptoms worsen/change me know, follow-up in office 1 year / as needed - Plan: QUEtiapine (SEROQUEL) 100 MG tablet    Follow-up plan: Return in about 1 year (around 06/23/2018) for annual recheck, sooner if needed .  Visit summary with medication list and pertinent instructions was printed for patient to review, alert [place] if any changes needed. All questions at time of visit were answered - patient instructed to contact office with any additional concerns. ER/RTC precautions were reviewed with the patient and understanding verbalized.

## 2017-09-28 DIAGNOSIS — M2669 Other specified disorders of temporomandibular joint: Secondary | ICD-10-CM | POA: Diagnosis not present

## 2017-09-28 DIAGNOSIS — G501 Atypical facial pain: Secondary | ICD-10-CM | POA: Diagnosis not present

## 2018-01-18 ENCOUNTER — Ambulatory Visit: Payer: 59 | Admitting: Family Medicine

## 2018-02-10 ENCOUNTER — Ambulatory Visit (INDEPENDENT_AMBULATORY_CARE_PROVIDER_SITE_OTHER): Payer: 59 | Admitting: Osteopathic Medicine

## 2018-02-10 ENCOUNTER — Encounter: Payer: Self-pay | Admitting: Osteopathic Medicine

## 2018-02-10 VITALS — BP 125/76 | HR 63 | Temp 98.1°F | Wt 151.5 lb

## 2018-02-10 DIAGNOSIS — N898 Other specified noninflammatory disorders of vagina: Secondary | ICD-10-CM | POA: Diagnosis not present

## 2018-02-10 DIAGNOSIS — Z23 Encounter for immunization: Secondary | ICD-10-CM | POA: Diagnosis not present

## 2018-02-10 MED ORDER — IBUPROFEN 800 MG PO TABS: 800.0000 mg | ORAL_TABLET | Freq: Three times a day (TID) | ORAL | 2 refills | Status: DC | PRN

## 2018-02-10 NOTE — Patient Instructions (Addendum)
I could not say for sure if this cyst will ever come back or not, so, just in case, I have sent a referral to OB/GYN down the hall.  If it does come back, it may need to be totally removed rather than just drained.   Numbing medicine we injected should last for a few hours, after that you may feel sore especially where we made a very small incision to drain the cyst.  The lidocaine cream should work fine for this as well as the ibuprofen, you can also apply ice to the area.  If any more severe pain, drainage, especially if fever or you noticed swollen lymph nodes in the groin area, please seek medical attention right away.

## 2018-02-10 NOTE — Progress Notes (Signed)
HPI: Andrea Stephens is a 32 y.o. female who  has a past medical history of History of bipolar disorder and History of seizure.  she presents to [facility] today, 02/10/18,  for chief complaint of:  Skin problem  . Location: vaginal area - L labia minora anteriorly . Quality: cyst-like lump, tender, feels like it's getting larger  . Duration: 2 weeks . Assoc signs/symptoms: no fever/chills or abdominal pain, no other rashes or skin problems     Past medical history, surgical history, and family history reviewed.  Current medication list and allergy/intolerance information reviewed.   (See remainder of HPI, ROS, Phys Exam below)    ASSESSMENT/PLAN: The primary encounter diagnosis was Vaginal cyst. A diagnosis of Need for influenza vaccination was also pertinent to this visit.  Bartholin cyst drained as below   Meds ordered this encounter  Medications  . ibuprofen (ADVIL,MOTRIN) 800 MG tablet    Sig: Take 1 tablet (800 mg total) by mouth every 8 (eight) hours as needed.    Dispense:  60 tablet    Refill:  2  Lidocaine cream also given from office   Procedure note: After discussion of risks versus benefits of total excision of the cyst versus drainage versus referral to OB/GYN, patient opts to just try to at least drain it.  Area was cleaned and was anesthetized with about 1 cc of lidocaine, after unsuccessful attempts at drainage with 25-gauge needle/syringe, very small incision was made with a scalpel and viscous whitish discharge was expressed from the lesion, consistent with normal vaginal discharge rather than purulent. Minimal blood loss. Patient tolerated procedure well.    Patient Instructions  I could not say for sure if this cyst will ever come back or not, so, just in case, I have sent a referral to OB/GYN down the hall.  If it does come back, it may need to be totally removed rather than just drained.   Numbing medicine we  injected should last for a few hours, after that you may feel sore especially where we made a very small incision to drain the cyst.  The lidocaine cream should work fine for this as well as the ibuprofen, you can also apply ice to the area.  If any more severe pain, drainage, especially if fever or you noticed swollen lymph nodes in the groin area, please seek medical attention right away.   Follow-up plan: Return if symptoms worsen or fail to improve.                   ############################################ ############################################ ############################################ ############################################    Outpatient Encounter Medications as of 02/10/2018  Medication Sig Note  . amoxicillin (AMOXIL) 875 MG tablet Take 1 tablet (875 mg total) by mouth 2 (two) times daily. (Patient not taking: Reported on 06/23/2017)   . ibuprofen (ADVIL,MOTRIN) 800 MG tablet Take 1 tablet (800 mg total) by mouth every 8 (eight) hours as needed. 06/23/2017: PRN  . QUEtiapine (SEROQUEL) 100 MG tablet Take 1 tablet (100 mg total) by mouth at bedtime.    No facility-administered encounter medications on file as of 02/10/2018.    No Known Allergies    Review of Systems:  Constitutional: No recent illness, no fevre  Cardiac: No  chest pain  Respiratory:  No  shortness of breath  Gastrointestinal: No  abdominal pain  Musculoskeletal: No new myalgia/arthralgia  Skin: +Rash as per HPI   Exam:  BP 125/76 (BP Location: Left Arm, Patient  Position: Sitting, Cuff Size: Normal)   Pulse 63   Temp 98.1 F (36.7 C) (Oral)   Wt 151 lb 8 oz (68.7 kg)   BMI 19.99 kg/m   Constitutional: VS see above. General Appearance: alert, well-developed, well-nourished, NAD   Respiratory: Normal respiratory effort.   Musculoskeletal: Gait normal.   Neurological: Normal balance/coordination. No tremor.  Skin: see below  Psychiatric: Normal  judgment/insight. Normal mood and affect. Oriented x3.  GYN: No rashesulcers to external genitalia, 1.5 cm diameter soft but tender cyst noted on left labia minora, internally, may be a centimeter from the clitoris, normal urethra, normal vaginal mucosa, physiologic discharge.   Visit summary with medication list and pertinent instructions was printed for patient to review, advised to alert [place] if any changes needed. All questions at time of visit were answered - patient instructed to contact office with any additional concerns. ER/RTC precautions were reviewed with the patient and understanding verbalized.   Follow-up plan: Return if symptoms worsen or fail to improve.  Note: Total time spent 25 minutes, greater than 50% of the visit was spent face-to-face counseling and coordinating care for the following: The primary encounter diagnosis was Vaginal cyst. A diagnosis of Need for influenza vaccination was also pertinent to this visit.Marland Kitchen  Please note: voice recognition software was used to produce this document, and typos may escape review. Please contact Dr. Sheppard Coil for any needed clarifications.

## 2018-05-02 ENCOUNTER — Other Ambulatory Visit: Payer: Self-pay | Admitting: Osteopathic Medicine

## 2018-05-02 DIAGNOSIS — K047 Periapical abscess without sinus: Secondary | ICD-10-CM

## 2018-06-12 ENCOUNTER — Ambulatory Visit (HOSPITAL_COMMUNITY)
Admission: EM | Admit: 2018-06-12 | Discharge: 2018-06-12 | Disposition: A | Discharge status: Home / Self Care | Payer: 59 | Attending: Family Medicine | Admitting: Family Medicine

## 2018-06-12 ENCOUNTER — Encounter (HOSPITAL_COMMUNITY): Payer: Self-pay | Admitting: *Deleted

## 2018-06-12 DIAGNOSIS — K047 Periapical abscess without sinus: Secondary | ICD-10-CM | POA: Insufficient documentation

## 2018-06-12 DIAGNOSIS — R220 Localized swelling, mass and lump, head: Secondary | ICD-10-CM | POA: Insufficient documentation

## 2018-06-12 HISTORY — DX: Unspecified convulsions: R56.9

## 2018-06-12 MED ORDER — NAPROXEN 500 MG PO TABS: 500.0000 mg | ORAL_TABLET | Freq: Two times a day (BID) | ORAL | 0 refills | Status: DC

## 2018-06-12 MED ORDER — AMOXICILLIN-POT CLAVULANATE 875-125 MG PO TABS: 1.0000 | ORAL_TABLET | Freq: Two times a day (BID) | ORAL | 0 refills | Status: DC

## 2018-06-12 NOTE — ED Triage Notes (Signed)
C/O left toothache x few days; today has swollen left jaw without fever.

## 2018-06-12 NOTE — ED Provider Notes (Signed)
[place]    CSN: [id number] Arrival date & time: 06/12/18  1707     History   Chief Complaint Chief Complaint  Patient presents with  . Facial Swelling    HPI Andrea Stephens is a 33 y.o. female.   Patient is a 33 year old female who presents with left lower dental pain and facial swelling.  The dental pain started approximately 2 days ago and then the swelling of her lower facial area appreciated.  Her symptoms have worsened.  She has been taking Tylenol and ibuprofen with mild relief of symptoms.  She denies any associated fever, trismus or trouble swallowing.  ROS per HPI      Past Medical History:  Diagnosis Date  . History of bipolar disorder   . History of seizure   . Seizures [facility])     Patient Active Problem List   Diagnosis Date Noted  . Dental infection 11/20/2016  . History of substance abuse ([place]) 02/24/2016  . Anxiety 02/17/2016  . Bipolar I disorder ([place]) 02/17/2016  . Breast lump in female 02/17/2016  . History of seizure 02/17/2016  . Routine screening for STI (sexually transmitted infection) 02/17/2016  . Adverse reaction to SSRI (selective serotonin reuptake inhibitor) 02/17/2016    History reviewed. No pertinent surgical history.  OB History   No obstetric history on file.      Home Medications    Prior to Admission medications   Medication Sig Start Date End Date Taking? Authorizing Provider  ibuprofen (ADVIL,MOTRIN) 800 MG tablet TAKE 1 TABLET BY MOUTH EVERY 8 HOURS AS NEEDED 05/03/18  Yes Emeterio Reeve, DO  QUEtiapine (SEROQUEL) 100 MG tablet Take 1 tablet (100 mg total) by mouth at bedtime. 06/23/17  Yes Emeterio Reeve, DO  amoxicillin-clavulanate (AUGMENTIN) 875-125 MG tablet Take 1 tablet by mouth every 12 (twelve) hours. 06/12/18   Loura Halt A, NP  ibuprofen (ADVIL,MOTRIN) 800 MG tablet Take 1 tablet (800 mg total) by mouth every 8 (eight) hours as needed. 02/10/18   Emeterio Reeve, DO  naproxen  (NAPROSYN) 500 MG tablet Take 1 tablet (500 mg total) by mouth 2 (two) times daily. 06/12/18   Orvan July, NP    Family History Family History  Problem Relation Age of Onset  . Cancer Mother   . Depression Mother   . Alcohol abuse Father   . Diabetes Paternal Aunt   . Diabetes Paternal Uncle     Social History Social History   Tobacco Use  . Smoking status: Current Every Day Smoker    Packs/day: 0.50    Types: Cigarettes  . Smokeless tobacco: Never Used  Substance Use Topics  . Alcohol use: No  . Drug use: Not Currently    Comment: clean x 2.5 yrs as of 06/12/2018     Allergies   Codeine   Review of Systems Review of Systems   Physical Exam Triage Vital Signs ED Triage Vitals  Enc Vitals Group     BP 06/12/18 1720 126/69     Pulse Rate 06/12/18 1720 77     Resp 06/12/18 1720 16     Temp 06/12/18 1720 98 F (36.7 C)     Temp Source 06/12/18 1720 Temporal     SpO2 06/12/18 1720 100 %     Weight --      Height --      Head Circumference --      Peak Flow --      Pain Score 06/12/18 1721 8  Pain Loc --      Pain Edu? --      Excl. in [place]? --    No data found.  Updated Vital Signs BP 126/69   Pulse 77   Temp 98 F (36.7 C) (Temporal)   Resp 16   LMP 06/07/2018 (Exact Date)   SpO2 100%   Visual Acuity Right Eye Distance:   Left Eye Distance:   Bilateral Distance:    Right Eye Near:   Left Eye Near:    Bilateral Near:     Physical Exam Vitals signs and nursing note reviewed.  Constitutional:      General: She is not in acute distress.    Appearance: Normal appearance. She is not ill-appearing, toxic-appearing or diaphoretic.  HENT:     Head: Normocephalic and atraumatic.     Jaw: Tenderness, swelling and pain on movement present. No trismus.     Mouth/Throat:     Mouth: Mucous membranes are moist.     Dentition: Dental tenderness, gingival swelling and dental caries present.     Pharynx: Oropharynx is clear.     Tonsils: Swelling: 0  on the right. 0 on the left.      Comments: Left lower gingival swelling and erythema.  Very tender to palpation.  Obvious dental carry with abscessed tooth.  No trismus Neck:     Musculoskeletal: Normal range of motion.  Pulmonary:     Effort: Pulmonary effort is normal.     Breath sounds: Normal breath sounds.  Musculoskeletal: Normal range of motion.  Lymphadenopathy:     Cervical: No cervical adenopathy.  Skin:    General: Skin is warm and dry.  Neurological:     Mental Status: She is alert.  Psychiatric:        Mood and Affect: Mood normal.      UC Treatments / Results  Labs (all labs ordered are listed, but only abnormal results are displayed) Labs Reviewed - No data to display  EKG None  Radiology No results found.  Procedures Procedures (including critical care time)  Medications Ordered in UC Medications - No data to display  Initial Impression / Assessment and Plan / UC Course  I have reviewed the triage vital signs and the nursing notes.  Pertinent labs & imaging results that were available during my care of the patient were reviewed by me and considered in my medical decision making (see chart for details).     We will go ahead and treat patient for dental infection with Augmentin Naproxen twice daily with food Patient has a dentist that she plans to follow-up with for further evaluation and management Instructed that if her symptoms worsening to include more severe swelling and trouble opening her mouth or trouble swallowing she needs to go to the ER. Patient understanding and agreeable to plan. Final Clinical Impressions(s) / UC Diagnoses   Final diagnoses:  Dental infection  Right facial swelling     Discharge Instructions     We will treat you for the dental infection today with Augmentin. You will take this twice a day for the next 7 days Naproxen twice a day with food for pain and inflammation Follow-up with your dentist for further  evaluation and management of the dental infection    ED Prescriptions    Medication Sig Dispense Auth. Provider   amoxicillin-clavulanate (AUGMENTIN) 875-125 MG tablet  (Status: Discontinued) Take 1 tablet by mouth every 12 (twelve) hours. 14 tablet Orvan July, NP  naproxen (NAPROSYN) 500 MG tablet Take 1 tablet (500 mg total) by mouth 2 (two) times daily. 30 tablet Marcianne Ozbun A, NP   amoxicillin-clavulanate (AUGMENTIN) 875-125 MG tablet Take 1 tablet by mouth every 12 (twelve) hours. 14 tablet Orvan July, NP     Controlled Substance Prescriptions [place] Controlled Substance Registry consulted? no   Orvan July, NP 06/12/18 2219

## 2018-06-12 NOTE — Discharge Instructions (Addendum)
We will treat you for the dental infection today with Augmentin. You will take this twice a day for the next 7 days Naproxen twice a day with food for pain and inflammation Follow-up with your dentist for further evaluation and management of the dental infection

## 2018-07-01 ENCOUNTER — Ambulatory Visit: Payer: 59 | Admitting: Osteopathic Medicine

## 2018-07-05 ENCOUNTER — Other Ambulatory Visit: Payer: Self-pay | Admitting: Osteopathic Medicine

## 2018-07-05 DIAGNOSIS — F319 Bipolar disorder, unspecified: Secondary | ICD-10-CM

## 2018-07-18 ENCOUNTER — Ambulatory Visit: Payer: 59 | Admitting: Osteopathic Medicine

## 2018-08-03 ENCOUNTER — Telehealth: Payer: Self-pay | Admitting: Osteopathic Medicine

## 2018-08-03 ENCOUNTER — Ambulatory Visit: Payer: 59 | Admitting: Osteopathic Medicine

## 2018-08-03 NOTE — Telephone Encounter (Signed)
Patient has had 3 recent no-shows to scheduled appointments. I believe her partner may have been making these appointments for her? I'm not going to dismiss at this time but if she no-shows a fourth time...  Please call patient - can let her know about no-show policy, I'm concerned about her and want to make sure she's ok. Reschedule if needed. If she needs refills, I'm ok to send, but she needs to be checking in with me once per year for annual physicals to maintain the prescription.   Thanks

## 2018-08-03 NOTE — Telephone Encounter (Signed)
Patient states that she called this morning to reschedule appointment. Did not make it this morning and no voicemail was left. States she called from 8-8:30am. Has rescheduled for tomorrow. No further questions at this time.

## 2018-08-03 NOTE — Telephone Encounter (Signed)
Left VM for Pt to return clinic call.  

## 2018-08-04 ENCOUNTER — Ambulatory Visit: Payer: 59 | Admitting: Osteopathic Medicine

## 2018-08-05 ENCOUNTER — Encounter: Payer: Self-pay | Admitting: Osteopathic Medicine

## 2018-08-05 ENCOUNTER — Ambulatory Visit (INDEPENDENT_AMBULATORY_CARE_PROVIDER_SITE_OTHER): Payer: 59 | Admitting: Osteopathic Medicine

## 2018-08-05 VITALS — BP 114/73 | HR 66 | Temp 98.0°F | Wt 149.3 lb

## 2018-08-05 DIAGNOSIS — F319 Bipolar disorder, unspecified: Secondary | ICD-10-CM

## 2018-08-05 DIAGNOSIS — Z0000 Encounter for general adult medical examination without abnormal findings: Secondary | ICD-10-CM | POA: Diagnosis not present

## 2018-08-05 MED ORDER — QUETIAPINE FUMARATE 100 MG PO TABS: 100.0000 mg | ORAL_TABLET | Freq: Every day | ORAL | 3 refills | Status: DC

## 2018-08-05 NOTE — Patient Instructions (Addendum)
General Preventive Care  Most recent routine screening lipids/other labs: annual blood work ordered today  Everyone should have blood pressure checked once per year.   Tobacco: don't! Please let me know if you need help quitting!  Alcohol: responsible moderation is ok for most adults - if you have concerns about your alcohol intake, please talk to me!   Exercise: as tolerated to reduce risk of cardiovascular disease and diabetes. Strength training will also prevent osteoporosis.   Mental health: if need for mental health care (medicines, counseling, other), or concerns about moods, please let me know!   Sexual health: if need for STD testing, or if concerns with libido/pain problems, please let me know!   Advanced Directive: Living Will and/or Healthcare Power of Attorney recommended for all adults, regardless of age or health.  Vaccines  Flu vaccine: recommended for almost everyone, every fall.   Shingles vaccine: Shingrix recommended after age 27.   Pneumonia vaccines: Prevnar and Pneumovax recommended after age 54  Tetanus booster: Tdap recommended every 10 years. Due 03/2026.   HPV vaccine: Gardasil recommended up to age 33 to prevent HPV-associated diseases, including certain cancers.  Cancer screenings   Colon cancer screening: recommended for everyone at age 71, but some folks need a colonoscopy sooner if risk factors   Breast cancer screening: mammogram recommended at age 7, or sooner if strong family history   Cervical cancer screening: Pap every 1 to 5 years depending on age and other risk factors. Due 03/2021  Lung cancer screening: CT chest every year for those sge 35 to 34 years old with certain smoking history.  Infection screenings . HIV, Gonorrhea/Chlamydia: screening as needed . Hepatitis C: recommended for anyone born 66-1966 . TB: certain at-risk populations, or depending on work requirements and/or travel history Other . Bone Density Test: recommended  for women at age 17

## 2018-08-05 NOTE — Progress Notes (Signed)
HPI: Andrea Stephens is a 33 y.o. female who  has a past medical history of History of bipolar disorder, History of seizure, and Seizures ([place]).  she presents to [facility] today, 08/05/18,  for chief complaint of: Annual physical     Patient here for annual physical / wellness exam.  See preventive care reviewed as below.  Additional concerns today include: None, just needs refills         Past medical, surgical, social and family history reviewed:  Patient Active Problem List   Diagnosis Date Noted  . Dental infection 11/20/2016  . History of substance abuse ([place]) 02/24/2016  . Anxiety 02/17/2016  . Bipolar I disorder ([place]) 02/17/2016  . Breast lump in female 02/17/2016  . History of seizure 02/17/2016  . Routine screening for STI (sexually transmitted infection) 02/17/2016  . Adverse reaction to SSRI (selective serotonin reuptake inhibitor) 02/17/2016    No past surgical history on file.  Social History   Tobacco Use  . Smoking status: Current Every Day Smoker    Packs/day: 0.50    Types: Cigarettes  . Smokeless tobacco: Never Used  Substance Use Topics  . Alcohol use: No    Family History  Problem Relation Age of Onset  . Cancer Mother   . Depression Mother   . Alcohol abuse Father   . Diabetes Paternal Aunt   . Diabetes Paternal Uncle      Current medication list and allergy/intolerance information reviewed:    Current Meds  Medication Sig  . naproxen (NAPROSYN) 500 MG tablet Take 1 tablet (500 mg total) by mouth 2 (two) times daily.  . QUEtiapine (SEROQUEL) 100 MG tablet Take 1 tablet (100 mg total) by mouth at bedtime.  . [DISCONTINUED] ibuprofen (ADVIL,MOTRIN) 800 MG tablet Take 1 tablet (800 mg total) by mouth every 8 (eight) hours as needed.  . [DISCONTINUED] ibuprofen (ADVIL,MOTRIN) 800 MG tablet TAKE 1 TABLET BY MOUTH EVERY 8 HOURS AS NEEDED  . [DISCONTINUED] QUEtiapine (SEROQUEL) 100 MG tablet TAKE 1  TABLET BY MOUTH AT BEDTIME     Allergies  Allergen Reactions  . Codeine Hives      Review of Systems:  Constitutional:  No  fever, no chills, No recent illness, No unintentional weight changes. No significant fatigue.   HEENT: No  headache, no vision change, no hearing change, No sore throat, No  sinus pressure  Cardiac: No  chest pain, No  pressure, No palpitations, No  Orthopnea  Respiratory:  No  shortness of breath. No  Cough  Gastrointestinal: No  abdominal pain, No  nausea, No  vomiting,  No  blood in stool, No  diarrhea, No  constipation   Musculoskeletal: No new myalgia/arthralgia  Skin: No  Rash  Genitourinary: No  incontinence, No  abnormal genital bleeding, No abnormal genital discharge  Hem/Onc: No  easy bruising/bleeding, No  abnormal lymph node  Endocrine: No cold intolerance,  No heat intolerance.   Neurologic: No  weakness, No  dizziness  Psychiatric: No  concerns with depression, No  concerns with anxiety, No sleep problems, No mood problems  Exam:  BP 114/73 (BP Location: Left Arm, Patient Position: Sitting, Cuff Size: Normal)   Pulse 66   Temp 98 F (36.7 C) (Oral)   Wt 149 lb 4.8 oz (67.7 kg)   BMI 19.70 kg/m   Constitutional: VS see above. General Appearance: alert, well-developed, well-nourished, NAD  Eyes: Normal lids and conjunctive, non-icteric sclera  Ears, Nose, Mouth,  Throat: MMM, Normal external inspection ears/nares/mouth/lips/gums. TM normal bilaterally. Pharynx/tonsils no erythema, no exudate. Nasal mucosa normal.   Neck: No masses, trachea midline. No thyroid enlargement. No tenderness/mass appreciated. No lymphadenopathy  Respiratory: Normal respiratory effort. no wheeze, no rhonchi, no rales  Cardiovascular: S1/S2 normal, no murmur, no rub/gallop auscultated. RRR. No lower extremity edema.   Gastrointestinal: Nontender, no masses. No hepatomegaly, no splenomegaly. No hernia appreciated. Bowel sounds normal. Rectal exam  deferred.   Musculoskeletal: Gait normal. No clubbing/cyanosis of digits.   Neurological: Normal balance/coordination. No tremor. No cranial nerve deficit on limited exam. Motor and sensation intact and symmetric. Cerebellar reflexes intact.   Skin: warm, dry, intact. No rash/ulcer. No concerning nevi or subq nodules on limited exam.    Psychiatric: Normal judgment/insight. Flat mood and affect - baseline. Oriented x3.       ASSESSMENT/PLAN: The primary encounter diagnosis was Annual physical exam. A diagnosis of Bipolar I disorder ([place]) was also pertinent to this visit.   Orders Placed This Encounter  Procedures  . CBC  . COMPLETE METABOLIC PANEL WITH GFR  . Lipid Panel w/reflex Direct LDL    Meds ordered this encounter  Medications  . QUEtiapine (SEROQUEL) 100 MG tablet    Sig: Take 1 tablet (100 mg total) by mouth at bedtime.    Dispense:  90 tablet    Refill:  3    No refills. Needs annual appt. W/PCP.    Patient Instructions  General Preventive Care  Most recent routine screening lipids/other labs: annual blood work ordered today  Everyone should have blood pressure checked once per year.   Tobacco: don't! Please let me know if you need help quitting!  Alcohol: responsible moderation is ok for most adults - if you have concerns about your alcohol intake, please talk to me!   Exercise: as tolerated to reduce risk of cardiovascular disease and diabetes. Strength training will also prevent osteoporosis.   Mental health: if need for mental health care (medicines, counseling, other), or concerns about moods, please let me know!   Sexual health: if need for STD testing, or if concerns with libido/pain problems, please let me know!   Advanced Directive: Living Will and/or Healthcare Power of Attorney recommended for all adults, regardless of age or health.  Vaccines  Flu vaccine: recommended for almost everyone, every fall.   Shingles vaccine: Shingrix recommended  after age 26.   Pneumonia vaccines: Prevnar and Pneumovax recommended after age 53  Tetanus booster: Tdap recommended every 10 years. Due 03/2026.   HPV vaccine: Gardasil recommended up to age 70 to prevent HPV-associated diseases, including certain cancers.  Cancer screenings   Colon cancer screening: recommended for everyone at age 45, but some folks need a colonoscopy sooner if risk factors   Breast cancer screening: mammogram recommended at age 38, or sooner if strong family history   Cervical cancer screening: Pap every 1 to 5 years depending on age and other risk factors. Due 03/2021  Lung cancer screening: CT chest every year for those sge 64 to 34 years old with certain smoking history.  Infection screenings . HIV, Gonorrhea/Chlamydia: screening as needed . Hepatitis C: recommended for anyone born 11-1964 . TB: certain at-risk populations, or depending on work requirements and/or travel history Other . Bone Density Test: recommended for women at age 65       Visit summary with medication list and pertinent instructions was printed for patient to review. All questions at time of visit were answered - patient  instructed to contact office with any additional concerns or updates. ER/RTC precautions were reviewed with the patient.    Please note: voice recognition software was used to produce this document, and typos may escape review. Please contact Dr. Sheppard Coil for any needed clarifications.     Follow-up plan: Return in about 1 year (around 08/05/2019) for annual physical and maintain medications - see me sooner if needed!Marland Kitchen

## 2018-10-12 ENCOUNTER — Telehealth: Payer: Self-pay | Admitting: Osteopathic Medicine

## 2018-10-12 NOTE — Telephone Encounter (Signed)
Please call patient: I received some paperwork like for disability or FMLA.  I am not sure what condition she is requesting this for, I do not have any similar paperwork on file that we have done in the past.  Can we schedule an in office or virtual visit to discuss details and make sure I get this paperwork right?  Thank you

## 2018-10-12 NOTE — Telephone Encounter (Signed)
Left VM for Pt to return clinic call to schedule.  

## 2019-04-19 ENCOUNTER — Other Ambulatory Visit: Payer: Self-pay

## 2019-04-19 ENCOUNTER — Encounter: Payer: Self-pay | Admitting: Osteopathic Medicine

## 2019-04-19 ENCOUNTER — Ambulatory Visit (INDEPENDENT_AMBULATORY_CARE_PROVIDER_SITE_OTHER): Payer: 59 | Admitting: Osteopathic Medicine

## 2019-04-19 VITALS — BP 117/76 | HR 90 | Temp 98.1°F | Wt 143.1 lb

## 2019-04-19 DIAGNOSIS — Z113 Encounter for screening for infections with a predominantly sexual mode of transmission: Secondary | ICD-10-CM

## 2019-04-19 DIAGNOSIS — Z20828 Contact with and (suspected) exposure to other viral communicable diseases: Secondary | ICD-10-CM | POA: Diagnosis not present

## 2019-04-19 NOTE — Progress Notes (Signed)
HPI: Andrea Stephens is a 33 y.o. female who  has a past medical history of History of bipolar disorder, History of seizure, and Seizures ([place]).  she presents to [facility] today, 04/19/19,  for chief complaint of:  STI testing   Here today for STD testing There has been a known exposure, her partner tested (+) for herpes and had an outbreak.  No IVDU.    At today's visit 04/19/19 ... PMH, PSH, FH reviewed and updated as needed.  Current medication list and allergy/intolerance hx reviewed and updated as needed. (See remainder of HPI, ROS, Phys Exam below)   No results found.  No results found for this or any previous visit (from the past 72 hour(s)).        ASSESSMENT/PLAN: The primary encounter diagnosis was Routine screening for STI (sexually transmitted infection). Diagnoses of Screening for STDs (sexually transmitted diseases) and Exposure to herpes simplex virus (HSV) were also pertinent to this visit.   Orders Placed This Encounter  Procedures  . C. trachomatis/N. gonorrhoeae RNA  . RPR  . HIV antibody  . Hepatitis B surface antigen  . Hepatitis C antibody, reflex  . Hepatitis B core antibody, total  . HEPATITIS C SCREENING - IF DOB [birth year]  . HSV(herpes simplex vrs) 1+2 ab-IgG  . HSV(herpes simplex vrs) 1+2 ab-IgM      Follow-up plan: Return for recheck as needed / when due for annual .                                                 ################################################# ################################################# ################################################# #################################################    No outpatient medications have been marked as taking for the 04/19/19 encounter (Office Visit) with Emeterio Reeve, DO.    Allergies  Allergen Reactions  . Codeine Hives       Review of Systems:  Constitutional: No recent  illness  Musculoskeletal: No new myalgia/arthralgia  Skin: No  Rash    Exam:  BP 117/76 (BP Location: Right Arm, Patient Position: Sitting, Cuff Size: Normal)   Pulse 90   Temp 98.1 F (36.7 C) (Oral)   Wt 143 lb 1.3 oz (64.9 kg)   BMI 18.88 kg/m   Constitutional: VS see above. General Appearance: alert, well-developed, well-nourished, NAD  Neck: No masses, trachea midline.   Musculoskeletal: Gait normal.   Neurological: Normal balance/coordination. No tremor.  Skin: warm, dry, intact.   Psychiatric: Normal judgment/insight. Normal mood and affect. Oriented x3.       Visit summary with medication list and pertinent instructions was printed for patient to review, patient was advised to alert [place] if any updates are needed. All questions at time of visit were answered - patient instructed to contact office with any additional concerns. ER/RTC precautions were reviewed with the patient and understanding verbalized.   Note: Total time spent 15 minutes, greater than 50% of the visit was spent face-to-face counseling and coordinating care for the following: The primary encounter diagnosis was Routine screening for STI (sexually transmitted infection). Diagnoses of Screening for STDs (sexually transmitted diseases) and Exposure to herpes simplex virus (HSV) were also pertinent to this visit.Marland Kitchen  Please note: voice recognition software was used to produce this document, and typos may escape review. Please contact Dr. Sheppard Coil for any needed clarifications.    Follow up plan: Return for recheck as  needed / when due for annual .

## 2019-04-20 ENCOUNTER — Other Ambulatory Visit: Payer: Self-pay | Admitting: Osteopathic Medicine

## 2019-04-20 DIAGNOSIS — D649 Anemia, unspecified: Secondary | ICD-10-CM

## 2019-04-20 LAB — HEPATITIS C ANTIBODY
Hepatitis C Ab: NONREACTIVE
SIGNAL TO CUT-OFF: 0.03 (ref <1.00)

## 2019-04-20 LAB — C. TRACHOMATIS/N. GONORRHOEAE RNA
C. trachomatis RNA, TMA: NOT DETECTED
N. gonorrhoeae RNA, TMA: NOT DETECTED

## 2019-04-20 LAB — RPR: RPR Ser Ql: NONREACTIVE

## 2019-04-20 LAB — HSV(HERPES SIMPLEX VRS) I + II AB-IGG
HAV 1 IGG,TYPE SPECIFIC AB: 32.1 index — ABNORMAL HIGH
HSV 2 IGG,TYPE SPECIFIC AB: <0.9 index

## 2019-04-20 LAB — HEPATITIS B CORE ANTIBODY, TOTAL: Hep B Core Total Ab: NONREACTIVE

## 2019-04-20 LAB — HEPATITIS B SURFACE ANTIGEN: Hepatitis B Surface Ag: NONREACTIVE

## 2019-04-20 LAB — HIV ANTIBODY (ROUTINE TESTING W REFLEX): HIV 1&2 Ab, 4th Generation: NONREACTIVE

## 2019-04-22 LAB — CBC
HCT: 34 % — ABNORMAL LOW (ref 35.0–45.0)
Hemoglobin: 11.4 g/dL — ABNORMAL LOW (ref 11.7–15.5)
MCH: 29.2 pg (ref 27.0–33.0)
MCHC: 33.5 g/dL (ref 32.0–36.0)
MCV: 87 fL (ref 80.0–100.0)
MPV: 10 fL (ref 7.5–12.5)
Platelets: 375 10*3/uL (ref 140–400)
RBC: 3.91 10*6/uL (ref 3.80–5.10)
RDW: 16.4 % — ABNORMAL HIGH (ref 11.0–15.0)
WBC: 9.5 10*3/uL (ref 3.8–10.8)

## 2019-04-22 LAB — COMPLETE METABOLIC PANEL WITH GFR
AG Ratio: 1.8 (calc) (ref 1.0–2.5)
ALT: 9 U/L (ref 6–29)
AST: 13 U/L (ref 10–30)
Albumin: 4.4 g/dL (ref 3.6–5.1)
Alkaline phosphatase (APISO): 29 U/L — ABNORMAL LOW (ref 31–125)
BUN: 12 mg/dL (ref 7–25)
CO2: 24 mmol/L (ref 20–32)
Calcium: 9.6 mg/dL (ref 8.6–10.2)
Chloride: 106 mmol/L (ref 98–110)
Creat: 0.88 mg/dL (ref 0.50–1.10)
GFR, Est African American: 99 mL/min/{1.73_m2} (ref >=60)
GFR, Est Non African American: 86 mL/min/{1.73_m2} (ref >=60)
Globulin: 2.4 g/dL (calc) (ref 1.9–3.7)
Glucose, Bld: 103 mg/dL — ABNORMAL HIGH (ref 65–99)
Potassium: 3.7 mmol/L (ref 3.5–5.3)
Sodium: 139 mmol/L (ref 135–146)
Total Bilirubin: 0.4 mg/dL (ref 0.2–1.2)
Total Protein: 6.8 g/dL (ref 6.1–8.1)

## 2019-04-22 LAB — LIPID PANEL W/REFLEX DIRECT LDL
Cholesterol: 132 mg/dL (ref <200)
HDL: 65 mg/dL (ref >=50)
LDL Cholesterol (Calc): 56 mg/dL (calc)
Non-HDL Cholesterol (Calc): 67 mg/dL (calc) (ref <130)
Total CHOL/HDL Ratio: 2 (calc) (ref <5.0)
Triglycerides: 39 mg/dL (ref <150)

## 2019-04-22 LAB — IRON,TIBC AND FERRITIN PANEL
%SAT: 19 % (calc) (ref 16–45)
Ferritin: 12 ng/mL — ABNORMAL LOW (ref 16–154)
Iron: 66 ug/dL (ref 40–190)
TIBC: 339 mcg/dL (calc) (ref 250–450)

## 2019-04-22 LAB — TEST AUTHORIZATION

## 2019-04-24 ENCOUNTER — Other Ambulatory Visit: Payer: Self-pay | Admitting: Osteopathic Medicine

## 2019-04-24 MED ORDER — FERROUS SULFATE 325 (65 FE) MG PO TBEC: 325.0000 mg | DELAYED_RELEASE_TABLET | Freq: Every day | ORAL | 3 refills | Status: DC

## 2019-04-26 NOTE — Progress Notes (Signed)
Letter mailed

## 2019-05-16 ENCOUNTER — Encounter (HOSPITAL_COMMUNITY): Payer: Self-pay

## 2019-05-16 ENCOUNTER — Ambulatory Visit (HOSPITAL_COMMUNITY)
Admission: EM | Admit: 2019-05-16 | Discharge: 2019-05-16 | Disposition: A | Discharge status: Home / Self Care | Payer: 59 | Attending: Family Medicine | Admitting: Family Medicine

## 2019-05-16 ENCOUNTER — Other Ambulatory Visit: Payer: Self-pay

## 2019-05-16 DIAGNOSIS — R059 Cough, unspecified: Secondary | ICD-10-CM

## 2019-05-16 DIAGNOSIS — R05 Cough: Secondary | ICD-10-CM | POA: Diagnosis not present

## 2019-05-16 DIAGNOSIS — R519 Headache, unspecified: Secondary | ICD-10-CM

## 2019-05-16 DIAGNOSIS — Z20828 Contact with and (suspected) exposure to other viral communicable diseases: Secondary | ICD-10-CM | POA: Diagnosis not present

## 2019-05-16 DIAGNOSIS — Z20822 Contact with and (suspected) exposure to covid-19: Secondary | ICD-10-CM

## 2019-05-16 LAB — POC SARS CORONAVIRUS 2 AG -  ED: SARS Coronavirus 2 Ag: NEGATIVE

## 2019-05-16 LAB — POC SARS CORONAVIRUS 2 AG: SARS Coronavirus 2 Ag: NEGATIVE

## 2019-05-16 MED ORDER — BENZONATATE 100 MG PO CAPS: 100.0000 mg | ORAL_CAPSULE | Freq: Three times a day (TID) | ORAL | 0 refills | Status: DC

## 2019-05-16 MED ORDER — CETIRIZINE HCL 10 MG PO TABS: 10.0000 mg | ORAL_TABLET | Freq: Every day | ORAL | 0 refills | Status: DC

## 2019-05-16 MED ORDER — FLUTICASONE PROPIONATE 50 MCG/ACT NA SUSP: 1.0000 | Freq: Every day | NASAL | 0 refills | Status: DC

## 2019-05-16 NOTE — ED Provider Notes (Signed)
Andrea Stephens    CSN: [id number] Arrival date & time: 05/16/19  1928      History   Chief Complaint Chief Complaint  Patient presents with   Cough    HPI Andrea Stephens is a 33 y.o. female.   Andrea Stephens 33 years old female presented to the urgent care with a complaint of headache, cough, joint pain x2 days.  Denies sick exposure to COVID, flu or strep.  Denies recent travel.  Denies aggravating or alleviating symptoms.  Denies previous COVID infection.   Denies fever, chills, fatigue, nasal congestion, rhinorrhea, sore throat, SOB, wheezing, chest pain, nausea, vomiting, changes in bowel or bladder habits.     The history is provided by the patient. No language interpreter was used.  Cough Associated symptoms: headaches     Past Medical History:  Diagnosis Date   History of bipolar disorder    History of seizure    Seizures ([place])     Patient Active Problem List   Diagnosis Date Noted   Anemia 04/20/2019   Dental infection 11/20/2016   History of substance abuse ([place]) 02/24/2016   Anxiety 02/17/2016   Bipolar I disorder ([place]) 02/17/2016   Breast lump in female 02/17/2016   History of seizure 02/17/2016   Routine screening for STI (sexually transmitted infection) 02/17/2016   Adverse reaction to SSRI (selective serotonin reuptake inhibitor) 02/17/2016    History reviewed. No pertinent surgical history.  OB History   No obstetric history on file.      Home Medications    Prior to Admission medications   Medication Sig Start Date End Date Taking? Authorizing Provider  benzonatate (TESSALON) 100 MG capsule Take 1 capsule (100 mg total) by mouth every 8 (eight) hours. 05/16/19   Lacreshia Bondarenko, Darrelyn Hillock, FNP  cetirizine (ZYRTEC ALLERGY) 10 MG tablet Take 1 tablet (10 mg total) by mouth daily. 05/16/19   Kashira Behunin, Darrelyn Hillock, FNP  ferrous sulfate 325 (65 FE) MG EC tablet Take 1 tablet (325 mg total) by mouth daily with breakfast. 04/24/19    Emeterio Reeve, DO  fluticasone [facility]) 50 MCG/ACT nasal spray Place 1 spray into both nostrils daily for 14 days. 05/16/19 05/30/19  Paizley Ramella, Darrelyn Hillock, FNP  naproxen (NAPROSYN) 500 MG tablet Take 1 tablet (500 mg total) by mouth 2 (two) times daily. 06/12/18   Loura Halt A, NP  QUEtiapine (SEROQUEL) 100 MG tablet Take 1 tablet (100 mg total) by mouth at bedtime. 08/05/18   Emeterio Reeve, DO    Family History Family History  Problem Relation Age of Onset   Cancer Mother    Depression Mother    Alcohol abuse Father    Diabetes Paternal Aunt    Diabetes Paternal Uncle     Social History Social History   Tobacco Use   Smoking status: Current Every Day Smoker    Packs/day: 0.50    Types: Cigarettes   Smokeless tobacco: Never Used  Substance Use Topics   Alcohol use: No   Drug use: Not Currently    Comment: clean x 2.5 yrs as of 06/12/2018     Allergies   Codeine   Review of Systems Review of Systems  Constitutional: Negative.   HENT: Negative.   Respiratory: Positive for cough.   Cardiovascular: Negative.   Gastrointestinal: Negative.   Musculoskeletal:       Joint pain  Neurological: Positive for headaches.  ROS: All other negative   Physical Exam Triage Vital Signs ED Triage Vitals  Enc Vitals Group     BP 05/16/19 1947 114/72     Pulse Rate 05/16/19 1947 84     Resp 05/16/19 1947 18     Temp 05/16/19 1947 98.7 F (37.1 C)     Temp Source 05/16/19 1947 Oral     SpO2 05/16/19 1947 100 %     Weight 05/16/19 1946 140 lb (63.5 kg)     Height --      Head Circumference --      Peak Flow --      Pain Score 05/16/19 1946 7     Pain Loc --      Pain Edu? --      Excl. in [place]? --    No data found.  Updated Vital Signs BP 114/72 (BP Location: Right Arm)    Pulse 84    Temp 98.7 F (37.1 C) (Oral)    Resp 18    Wt 140 lb (63.5 kg)    SpO2 100%    BMI 18.47 kg/m   Visual Acuity Right Eye Distance:   Left Eye Distance:   Bilateral  Distance:    Right Eye Near:   Left Eye Near:    Bilateral Near:     Physical Exam Vitals and nursing note reviewed.  Constitutional:      General: She is not in acute distress.    Appearance: Normal appearance. She is normal weight. She is not ill-appearing or toxic-appearing.  HENT:     Head: Normocephalic.     Right Ear: Tympanic membrane, ear canal and external ear normal. There is no impacted cerumen.     Left Ear: Ear canal and external ear normal. There is no impacted cerumen.     Nose: Nose normal. No congestion.     Mouth/Throat:     Mouth: Mucous membranes are moist.     Pharynx: No oropharyngeal exudate or posterior oropharyngeal erythema.  Cardiovascular:     Rate and Rhythm: Normal rate and regular rhythm.     Pulses: Normal pulses.     Heart sounds: Normal heart sounds. No murmur.  Pulmonary:     Effort: Pulmonary effort is normal. No respiratory distress.     Breath sounds: No wheezing or rhonchi.  Chest:     Chest wall: No tenderness.  Abdominal:     General: Abdomen is flat. Bowel sounds are normal. There is no distension.     Palpations: There is no mass.  Skin:    Capillary Refill: Capillary refill takes less than 2 seconds.  Neurological:     Mental Status: She is alert and oriented to person, place, and time.      UC Treatments / Results  Labs (all labs ordered are listed, but only abnormal results are displayed) Labs Reviewed  POC SARS CORONAVIRUS 2 AG -  ED  POC SARS CORONAVIRUS 2 AG    EKG   Radiology No results found.  Procedures Procedures (including critical care time)  Medications Ordered in UC Medications - No data to display  Initial Impression / Assessment and Plan / UC Course  I have reviewed the triage vital signs and the nursing notes.  Pertinent labs & imaging results that were available during my care of the patient were reviewed by me and considered in my medical decision making (see chart for details).     Patient  stable for discharge.  Benign physical exam.  Covid test was ordered.  Advised patient that he will be  called only if result is abnormal. Final Clinical Impressions(s) / UC Diagnoses   Final diagnoses:  Suspected COVID-19 virus infection  Cough     Discharge Instructions     COVID testing ordered.  It will take between 5-7 days for test results.  Someone will contact you regarding abnormal results.    In the meantime: You should remain isolated in your home for 10 days from symptom onset AND greater than 72 hours after symptoms resolution (absence of fever without the use of fever-reducing medication and improvement in respiratory symptoms), whichever is longer Get plenty of rest and push fluids Tessalon Perles prescribed for cough Zyrtec-D prescribed for nasal congestion, runny nose, and/or sore throat Flonase prescribed for nasal congestion and runny nose Use medications daily for symptom relief Use OTC medications like ibuprofen or tylenol as needed fever or pain Call or go to the ED if you have any new or worsening symptoms such as fever, worsening cough, shortness of breath, chest tightness, chest pain, turning blue, changes in mental status, etc...     ED Prescriptions    Medication Sig Dispense Auth. Provider   benzonatate (TESSALON) 100 MG capsule Take 1 capsule (100 mg total) by mouth every 8 (eight) hours. 21 capsule Rahil Passey, Darrelyn Hillock, FNP   cetirizine (ZYRTEC ALLERGY) 10 MG tablet Take 1 tablet (10 mg total) by mouth daily. 30 tablet Scherrie Seneca S, FNP   fluticasone (FLONASE) 50 MCG/ACT nasal spray Place 1 spray into both nostrils daily for 14 days. 16 g Emerson Monte, FNP     PDMP not reviewed this encounter.   Emerson Monte, [place] 05/16/19 2027

## 2019-05-16 NOTE — ED Triage Notes (Signed)
Pt cc headache, cough and joint pain. X 2 days.

## 2019-05-16 NOTE — Discharge Instructions (Signed)
COVID testing ordered.  It will take between 5-7 days for test results.  Someone will contact you regarding abnormal results.    In the meantime: You should remain isolated in your home for 10 days from symptom onset AND greater than 72 hours after symptoms resolution (absence of fever without the use of fever-reducing medication and improvement in respiratory symptoms), whichever is longer Get plenty of rest and push fluids Tessalon Perles prescribed for cough Zyrtec-D prescribed for nasal congestion, runny nose, and/or sore throat Flonase prescribed for nasal congestion and runny nose Use medications daily for symptom relief Use OTC medications like ibuprofen or tylenol as needed fever or pain Call or go to the ED if you have any new or worsening symptoms such as fever, worsening cough, shortness of breath, chest tightness, chest pain, turning blue, changes in mental status, etc..Marland Kitchen

## 2019-06-19 ENCOUNTER — Other Ambulatory Visit: Payer: Self-pay

## 2019-06-19 DIAGNOSIS — F319 Bipolar disorder, unspecified: Secondary | ICD-10-CM

## 2019-06-19 MED ORDER — QUETIAPINE FUMARATE 100 MG PO TABS: 100.0000 mg | ORAL_TABLET | Freq: Every day | ORAL | 0 refills | Status: DC

## 2019-06-28 DIAGNOSIS — F3131 Bipolar disorder, current episode depressed, mild: Secondary | ICD-10-CM | POA: Diagnosis not present

## 2019-07-05 DIAGNOSIS — F3131 Bipolar disorder, current episode depressed, mild: Secondary | ICD-10-CM | POA: Diagnosis not present

## 2019-07-14 DIAGNOSIS — F331 Major depressive disorder, recurrent, moderate: Secondary | ICD-10-CM | POA: Diagnosis not present

## 2019-08-18 ENCOUNTER — Ambulatory Visit (INDEPENDENT_AMBULATORY_CARE_PROVIDER_SITE_OTHER): Payer: BC Managed Care – PPO | Admitting: Medical-Surgical

## 2019-08-18 ENCOUNTER — Other Ambulatory Visit: Payer: Self-pay

## 2019-08-18 ENCOUNTER — Encounter: Payer: Self-pay | Admitting: Medical-Surgical

## 2019-08-18 VITALS — BP 130/72 | HR 83 | Temp 98.1°F | Ht 73.0 in | Wt 142.1 lb

## 2019-08-18 DIAGNOSIS — B9689 Other specified bacterial agents as the cause of diseases classified elsewhere: Secondary | ICD-10-CM

## 2019-08-18 DIAGNOSIS — Z113 Encounter for screening for infections with a predominantly sexual mode of transmission: Secondary | ICD-10-CM | POA: Diagnosis not present

## 2019-08-18 DIAGNOSIS — N760 Acute vaginitis: Secondary | ICD-10-CM

## 2019-08-18 LAB — WET PREP FOR TRICH, YEAST, CLUE
MICRO NUMBER:: 10270609
Specimen Quality: ADEQUATE

## 2019-08-18 MED ORDER — METRONIDAZOLE 500 MG PO TABS: 500.0000 mg | ORAL_TABLET | Freq: Two times a day (BID) | ORAL | 0 refills | Status: DC

## 2019-08-18 NOTE — Progress Notes (Signed)
Subjective:    CC: STI testing, vaginal discharge  HPI: Pleasant 34 year old female presenting today for STI testing.  Also reports white/cloudy malodorous vaginal discharge for the last 2 weeks.  Odor described as a fishy smell.  No abnormal vaginal bleeding.  Odor started accompanied with itching and burning but that has since resolved.  Currently sexually active with female partners.  Last STI testing November 2020, negative.  I reviewed the past medical history, family history, social history, surgical history, and allergies today and no changes were needed.  Please see the problem list section below in epic for further details.  Past Medical History: Past Medical History:  Diagnosis Date  . History of bipolar disorder   . History of seizure   . Seizures ([place])    Past Surgical History: History reviewed. No pertinent surgical history. Social History: Social History   Socioeconomic History  . Marital status: Single    Spouse name: Not on file  . Number of children: Not on file  . Years of education: Not on file  . Highest education level: Not on file  Occupational History  . Not on file  Tobacco Use  . Smoking status: Current Every Day Smoker    Packs/day: 0.50    Types: Cigarettes  . Smokeless tobacco: Never Used  Substance and Sexual Activity  . Alcohol use: No  . Drug use: Not Currently    Comment: clean x 2.5 yrs as of 06/12/2018  . Sexual activity: Not on file  Other Topics Concern  . Not on file  Social History Narrative  . Not on file   Social Determinants of Health   Financial Resource Strain:   . Difficulty of Paying Living Expenses:   Food Insecurity:   . Worried About Charity fundraiser in the Last Year:   . Arboriculturist in the Last Year:   Transportation Needs:   . Film/video editor (Medical):   Marland Kitchen Lack of Transportation (Non-Medical):   Physical Activity:   . Days of Exercise per Week:   . Minutes of Exercise per Session:   Stress:   .  Feeling of Stress :   Social Connections:   . Frequency of Communication with Friends and Family:   . Frequency of Social Gatherings with Friends and Family:   . Attends Religious Services:   . Active Member of Clubs or Organizations:   . Attends Archivist Meetings:   Marland Kitchen Marital Status:    Family History: Family History  Problem Relation Age of Onset  . Cancer Mother   . Depression Mother   . Alcohol abuse Father   . Diabetes Paternal Aunt   . Diabetes Paternal Uncle    Allergies: Allergies  Allergen Reactions  . Codeine Hives   Medications: See med rec.  Review of Systems: No fevers, chills, night sweats, weight loss, chest pain, or shortness of breath.   Objective:    General: Well Developed, well nourished, and in no acute distress.  Neuro: Alert and oriented x3.  HEENT: Normocephalic, atraumatic.  Skin: Warm and dry. Cardiac: Regular rate and rhythm, no murmurs rubs or gallops, no lower extremity edema.  Respiratory: Clear to auscultation bilaterally. Not using accessory muscles, speaking in full sentences.   Impression and Recommendations:    1. Screening for STD (sexually transmitted disease) Checking urine for chlamydia and gonorrhea. Self swab for trich, yeast, and BV. Checking HSV, Hep C, Hep B, RPR, and HIV. - C. trachomatis/N. gonorrhoeae RNA -  HSV(herpes simplex vrs) 1+2 ab-IgG - Hepatitis C antibody, reflex - Hepatitis B surface antigen - RPR - HIV Antibody (routine testing w rflx) - WET PREP FOR TRICH, YEAST, CLUE  2. BV (bacterial vaginosis) Symptoms classic for BV. Treating with Metronidazole empirically. Advised to avoid alcohol while taking this medicaiton. - metroNIDAZOLE (FLAGYL) 500 MG tablet; Take 1 tablet (500 mg total) by mouth 2 (two) times daily for 7 days.  Dispense: 14 tablet; Refill: 0  Return if symptoms worsen or fail to improve. ___________________________________________ Clearnce Sorrel, DNP, APRN, FNP-BC [facility] [place]

## 2019-08-21 LAB — C. TRACHOMATIS/N. GONORRHOEAE RNA
C. trachomatis RNA, TMA: NOT DETECTED
C. trachomatis RNA, TMA: NOT DETECTED
N. gonorrhoeae RNA, TMA: NOT DETECTED
N. gonorrhoeae RNA, TMA: NOT DETECTED

## 2019-08-21 LAB — HEPATITIS B SURFACE ANTIGEN: Hepatitis B Surface Ag: NONREACTIVE

## 2019-08-21 LAB — HSV(HERPES SIMPLEX VRS) I + II AB-IGG
HAV 1 IGG,TYPE SPECIFIC AB: 53.2 index — ABNORMAL HIGH
HSV 2 IGG,TYPE SPECIFIC AB: 1.01 index — ABNORMAL HIGH

## 2019-08-21 LAB — RPR: RPR Ser Ql: NONREACTIVE

## 2019-08-21 LAB — HIV ANTIBODY (ROUTINE TESTING W REFLEX): HIV 1&2 Ab, 4th Generation: NONREACTIVE

## 2019-08-23 DIAGNOSIS — F331 Major depressive disorder, recurrent, moderate: Secondary | ICD-10-CM | POA: Diagnosis not present

## 2019-09-06 DIAGNOSIS — F331 Major depressive disorder, recurrent, moderate: Secondary | ICD-10-CM | POA: Diagnosis not present

## 2019-09-11 DIAGNOSIS — F331 Major depressive disorder, recurrent, moderate: Secondary | ICD-10-CM | POA: Diagnosis not present

## 2019-09-17 ENCOUNTER — Other Ambulatory Visit: Payer: Self-pay | Admitting: Osteopathic Medicine

## 2019-09-17 DIAGNOSIS — F319 Bipolar disorder, unspecified: Secondary | ICD-10-CM

## 2019-09-18 DIAGNOSIS — F331 Major depressive disorder, recurrent, moderate: Secondary | ICD-10-CM | POA: Diagnosis not present

## 2019-09-19 ENCOUNTER — Other Ambulatory Visit: Payer: Self-pay

## 2019-09-19 DIAGNOSIS — F319 Bipolar disorder, unspecified: Secondary | ICD-10-CM

## 2019-09-19 MED ORDER — QUETIAPINE FUMARATE 100 MG PO TABS: 100.0000 mg | ORAL_TABLET | Freq: Every evening | ORAL | 0 refills | Status: DC

## 2019-10-09 DIAGNOSIS — F331 Major depressive disorder, recurrent, moderate: Secondary | ICD-10-CM | POA: Diagnosis not present

## 2019-10-23 DIAGNOSIS — F331 Major depressive disorder, recurrent, moderate: Secondary | ICD-10-CM | POA: Diagnosis not present

## 2019-11-07 DIAGNOSIS — F331 Major depressive disorder, recurrent, moderate: Secondary | ICD-10-CM | POA: Diagnosis not present

## 2019-11-13 DIAGNOSIS — F331 Major depressive disorder, recurrent, moderate: Secondary | ICD-10-CM | POA: Diagnosis not present

## 2019-11-20 DIAGNOSIS — F331 Major depressive disorder, recurrent, moderate: Secondary | ICD-10-CM | POA: Diagnosis not present

## 2019-12-04 DIAGNOSIS — F331 Major depressive disorder, recurrent, moderate: Secondary | ICD-10-CM | POA: Diagnosis not present

## 2019-12-12 DIAGNOSIS — F331 Major depressive disorder, recurrent, moderate: Secondary | ICD-10-CM | POA: Diagnosis not present

## 2019-12-17 DIAGNOSIS — Z1152 Encounter for screening for COVID-19: Secondary | ICD-10-CM | POA: Diagnosis not present

## 2019-12-27 ENCOUNTER — Encounter (HOSPITAL_COMMUNITY): Payer: Self-pay

## 2019-12-27 ENCOUNTER — Emergency Department (HOSPITAL_COMMUNITY)
Admission: EM | Admit: 2019-12-27 | Discharge: 2019-12-28 | Disposition: A | Discharge status: Home / Self Care | Payer: BC Managed Care – PPO | Attending: Emergency Medicine | Admitting: Emergency Medicine

## 2019-12-27 ENCOUNTER — Other Ambulatory Visit: Payer: Self-pay

## 2019-12-27 DIAGNOSIS — F17210 Nicotine dependence, cigarettes, uncomplicated: Secondary | ICD-10-CM | POA: Insufficient documentation

## 2019-12-27 DIAGNOSIS — U071 COVID-19: Secondary | ICD-10-CM | POA: Diagnosis not present

## 2019-12-27 DIAGNOSIS — R0989 Other specified symptoms and signs involving the circulatory and respiratory systems: Secondary | ICD-10-CM | POA: Diagnosis not present

## 2019-12-27 DIAGNOSIS — Z20822 Contact with and (suspected) exposure to covid-19: Secondary | ICD-10-CM

## 2019-12-27 MED ORDER — IBUPROFEN 800 MG PO TABS
800.0000 mg | ORAL_TABLET | Freq: Once | ORAL | Status: AC
  Administered 2019-12-27: 800 mg via ORAL
  Filled 2019-12-27: qty 1

## 2019-12-27 MED ORDER — ACETAMINOPHEN 325 MG PO TABS
650.0000 mg | ORAL_TABLET | Freq: Once | ORAL | Status: AC | PRN
  Administered 2019-12-27: 650 mg via ORAL
  Filled 2019-12-27: qty 2

## 2019-12-27 NOTE — ED Triage Notes (Signed)
Pt complains of COVID sx, fever, chills and a headache since this morning, pt states that she just got back from [place] two days ago

## 2019-12-27 NOTE — Discharge Instructions (Addendum)
Person Under Monitoring Name: Andrea Stephens  Location: [address] [place] [facility] [address]   Infection Prevention Recommendations for Individuals Confirmed to have, or Being Evaluated for, 2019 Novel Coronavirus (COVID-19) Infection Who Receive Care at Home  Individuals who are confirmed to have, or are being evaluated for, COVID-19 should follow the prevention steps below until a healthcare provider or local or state health department says they can return to normal activities.  Stay home except to get medical care You should restrict activities outside your home, except for getting medical care. Do not go to work, school, or public areas, and do not use public transportation or taxis.  Call ahead before visiting your doctor Before your medical appointment, call the healthcare provider and tell them that you have, or are being evaluated for, COVID-19 infection. This will help the healthcare providers office take steps to keep other people from getting infected. Ask your healthcare provider to call the local or state health department.  Monitor your symptoms Seek prompt medical attention if your illness is worsening (e.g., difficulty breathing). Before going to your medical appointment, call the healthcare provider and tell them that you have, or are being evaluated for, COVID-19 infection. Ask your healthcare provider to call the local or state health department.  Wear a facemask You should wear a facemask that covers your nose and mouth when you are in the same room with other people and when you visit a healthcare provider. People who live with or visit you should also wear a facemask while they are in the same room with you.  Separate yourself from other people in your home As much as possible, you should stay in a different room from other people in your home. Also, you should use a separate bathroom, if available.  Avoid sharing household items You should not  share dishes, drinking glasses, cups, eating utensils, towels, bedding, or other items with other people in your home. After using these items, you should wash them thoroughly with soap and water.  Cover your coughs and sneezes Cover your mouth and nose with a tissue when you cough or sneeze, or you can cough or sneeze into your sleeve. Throw used tissues in a lined trash can, and immediately wash your hands with soap and water for at least 20 seconds or use an alcohol-based hand rub.  Wash your [facility] your hands often and thoroughly with soap and water for at least 20 seconds. You can use an alcohol-based hand sanitizer if soap and water are not available and if your hands are not visibly dirty. Avoid touching your eyes, nose, and mouth with unwashed hands.   Prevention Steps for Caregivers and Household Members of Individuals Confirmed to have, or Being Evaluated for, COVID-19 Infection Being Cared for in the Home  If you live with, or provide care at home for, a person confirmed to have, or being evaluated for, COVID-19 infection please follow these guidelines to prevent infection:  Follow healthcare providers instructions Make sure that you understand and can help the patient follow any healthcare provider instructions for all care.  Provide for the patients basic needs You should help the patient with basic needs in the home and provide support for getting groceries, prescriptions, and other personal needs.  Monitor the patients symptoms If they are getting sicker, call his or her medical provider and tell them that the patient has, or is being evaluated for, COVID-19 infection. This will help the healthcare providers office  take steps to keep other people from getting infected. Ask the healthcare provider to call the local or state health department.  Limit the number of people who have contact with the patient If possible, have only one caregiver for the  patient. Other household members should stay in another home or place of residence. If this is not possible, they should stay in another room, or be separated from the patient as much as possible. Use a separate bathroom, if available. Restrict visitors who do not have an essential need to be in the home.  Keep older adults, very young children, and other sick people away from the patient Keep older adults, very young children, and those who have compromised immune systems or chronic health conditions away from the patient. This includes people with chronic heart, lung, or kidney conditions, diabetes, and cancer.  Ensure good ventilation Make sure that shared spaces in the home have good air flow, such as from an air conditioner or an opened window, weather permitting.  Wash your hands often Wash your hands often and thoroughly with soap and water for at least 20 seconds. You can use an alcohol based hand sanitizer if soap and water are not available and if your hands are not visibly dirty. Avoid touching your eyes, nose, and mouth with unwashed hands. Use disposable paper towels to dry your hands. If not available, use dedicated cloth towels and replace them when they become wet.  Wear a facemask and gloves Wear a disposable facemask at all times in the room and gloves when you touch or have contact with the patients blood, body fluids, and/or secretions or excretions, such as sweat, saliva, sputum, nasal mucus, vomit, urine, or feces.  Ensure the mask fits over your nose and mouth tightly, and do not touch it during use. Throw out disposable facemasks and gloves after using them. Do not reuse. Wash your hands immediately after removing your facemask and gloves. If your personal clothing becomes contaminated, carefully remove clothing and launder. Wash your hands after handling contaminated clothing. Place all used disposable facemasks, gloves, and other waste in a lined container before  disposing them with other household waste. Remove gloves and wash your hands immediately after handling these items.  Do not share dishes, glasses, or other household items with the patient Avoid sharing household items. You should not share dishes, drinking glasses, cups, eating utensils, towels, bedding, or other items with a patient who is confirmed to have, or being evaluated for, COVID-19 infection. After the person uses these items, you should wash them thoroughly with soap and water.  Wash laundry thoroughly Immediately remove and wash clothes or bedding that have blood, body fluids, and/or secretions or excretions, such as sweat, saliva, sputum, nasal mucus, vomit, urine, or feces, on them. Wear gloves when handling laundry from the patient. Read and follow directions on labels of laundry or clothing items and detergent. In general, wash and dry with the warmest temperatures recommended on the label.  Clean all areas the individual has used often Clean all touchable surfaces, such as counters, tabletops, doorknobs, bathroom fixtures, toilets, phones, keyboards, tablets, and bedside tables, every day. Also, clean any surfaces that may have blood, body fluids, and/or secretions or excretions on them. Wear gloves when cleaning surfaces the patient has come in contact with. Use a diluted bleach solution (e.g., dilute bleach with 1 part bleach and 10 parts water) or a household disinfectant with a label that says EPA-registered for coronaviruses. To make a bleach  solution at home, add 1 tablespoon of bleach to 1 quart (4 cups) of water. For a larger supply, add  cup of bleach to 1 gallon (16 cups) of water. Read labels of cleaning products and follow recommendations provided on product labels. Labels contain instructions for safe and effective use of the cleaning product including precautions you should take when applying the product, such as wearing gloves or eye protection and making sure you  have good ventilation during use of the product. Remove gloves and wash hands immediately after cleaning.  Monitor yourself for signs and symptoms of illness Caregivers and household members are considered close contacts, should monitor their health, and will be asked to limit movement outside of the home to the extent possible. Follow the monitoring steps for close contacts listed on the symptom monitoring form.   ? If you have additional questions, contact your local health department or call the epidemiologist on call at [phone] (available 24/7). ? This guidance is subject to change. For the most up-to-date guidance from [facility], please refer to their website: [email or web address]

## 2019-12-27 NOTE — ED Provider Notes (Signed)
[facility] DEPT Provider Note   CSN: [id number] Arrival date & time: 12/27/19  2213     History No chief complaint on file.   Andrea Stephens is a 34 y.o. female.  The history is provided by the patient.  URI Presenting symptoms: congestion and fever   Presenting symptoms: no cough   Presenting symptoms comment:  Loss of taste and smell Severity:  Moderate Onset quality:  Gradual Duration:  2 days Timing:  Constant Progression:  Unchanged Chronicity:  New Relieved by:  Nothing Worsened by:  Nothing Ineffective treatments:  None tried Associated symptoms: headaches   Associated symptoms: no arthralgias and no neck pain   Risk factors: recent travel   Risk factors: not elderly   Risk factors comment:  Went to [place] on a plane, no mask and is unvaccinated       Past Medical History:  Diagnosis Date  . History of bipolar disorder   . History of seizure   . Seizures [facility])     Patient Active Problem List   Diagnosis Date Noted  . Anemia 04/20/2019  . Dental infection 11/20/2016  . History of substance abuse ([place]) 02/24/2016  . Anxiety 02/17/2016  . Bipolar I disorder ([place]) 02/17/2016  . Breast lump in female 02/17/2016  . History of seizure 02/17/2016  . Routine screening for STI (sexually transmitted infection) 02/17/2016  . Adverse reaction to SSRI (selective serotonin reuptake inhibitor) 02/17/2016    History reviewed. No pertinent surgical history.   OB History   No obstetric history on file.     Family History  Problem Relation Age of Onset  . Cancer Mother   . Depression Mother   . Alcohol abuse Father   . Diabetes Paternal Aunt   . Diabetes Paternal Uncle     Social History   Tobacco Use  . Smoking status: Current Every Day Smoker    Packs/day: 0.50    Types: Cigarettes  . Smokeless tobacco: Never Used  Substance Use Topics  . Alcohol use: No  . Drug use: Not Currently    Comment: clean x 2.5 yrs as of  06/12/2018    Home Medications Prior to Admission medications   Medication Sig Start Date End Date Taking? Authorizing Provider  lamoTRIgine (LAMICTAL) 100 MG tablet Take 100 mg by mouth daily. 12/12/19  Yes [provider]  QUEtiapine (SEROQUEL) 100 MG tablet Take 1 tablet (100 mg total) by mouth at bedtime. 09/19/19 12/27/19 Yes Emeterio Reeve, DO    Allergies    Codeine  Review of Systems   Review of Systems  Constitutional: Positive for chills and fever.  HENT: Positive for congestion.   Eyes: Negative for visual disturbance.  Respiratory: Negative for cough and shortness of breath.   Cardiovascular: Negative for chest pain.  Gastrointestinal: Negative for abdominal distention.  Genitourinary: Negative for difficulty urinating.  Musculoskeletal: Negative for arthralgias and neck pain.  Skin: Negative for rash.  Neurological: Positive for headaches.  Psychiatric/Behavioral: Negative for agitation.  All other systems reviewed and are negative.   Physical Exam Updated Vital Signs BP 124/73 (BP Location: Right Arm)   Pulse 86   Temp (!) 103 F (39.4 C) (Oral)   Resp 18   LMP 12/27/2019   SpO2 100%   Physical Exam Vitals and nursing note reviewed. Exam conducted with a chaperone present.  Constitutional:      General: She is not in acute distress.    Appearance: Normal appearance.  HENT:  Head: Normocephalic and atraumatic.     Nose: Nose normal.  Eyes:     Conjunctiva/sclera: Conjunctivae normal.     Pupils: Pupils are equal, round, and reactive to light.  Cardiovascular:     Rate and Rhythm: Normal rate and regular rhythm.     Pulses: Normal pulses.     Heart sounds: Normal heart sounds.  Pulmonary:     Effort: Pulmonary effort is normal.     Breath sounds: Normal breath sounds.  Abdominal:     General: Abdomen is flat. Bowel sounds are normal.     Tenderness: There is no abdominal tenderness. There is no guarding or rebound.  Musculoskeletal:         General: Normal range of motion.     Cervical back: Normal range of motion and neck supple.  Skin:    General: Skin is warm and dry.     Capillary Refill: Capillary refill takes less than 2 seconds.  Neurological:     General: No focal deficit present.     Mental Status: She is alert and oriented to person, place, and time.     Deep Tendon Reflexes: Reflexes normal.  Psychiatric:        Mood and Affect: Mood normal.        Behavior: Behavior normal.     ED Results / Procedures / Treatments   Labs (all labs ordered are listed, but only abnormal results are displayed) Labs Reviewed  SARS CORONAVIRUS 2 BY RT PCR (HOSPITAL ORDER, [facility])    EKG None  Radiology No results found.  Procedures Procedures (including critical care time)  Medications Ordered in ED Medications  acetaminophen (TYLENOL) tablet 650 mg (650 mg Oral Given 12/27/19 2300)  ibuprofen (ADVIL) tablet 800 mg (800 mg Oral Given 12/27/19 2346)    ED Course  I have reviewed the triage vital signs and the nursing notes.  Pertinent labs & imaging results that were available during my care of the patient were reviewed by me and considered in my medical decision making (see chart for details).   Symptoms are consistent with covid.  Well appearing, saturating 100% on room air.  Was in a high risk area.  Placed in home isolation. Patient informed she should get home pulse ox.  Strict home isolation precautions given, strict return precautions given.     Andrea Stephens was evaluated in Emergency Department on 12/27/2019 for the symptoms described in the history of present illness. She was evaluated in the context of the global COVID-19 pandemic, which necessitated consideration that the patient might be at risk for infection with the SARS-CoV-2 virus that causes COVID-19. Institutional protocols and algorithms that pertain to the evaluation of patients at risk for COVID-19 are in a  state of rapid change based on information released by regulatory bodies including the CDC and federal and state organizations. These policies and algorithms were followed during the patient's care in the ED. Final Clinical Impression(s) / ED Diagnoses Return for intractable cough, coughing up blood,fevers >100.4 unrelieved by medication, shortness of breath, intractable vomiting, chest pain, shortness of breath, weakness,numbness, changes in speech, facial asymmetry,abdominal pain, passing out,Inability to tolerate liquids or food, cough, altered mental status or any concerns. No signs of systemic illness or infection. The patient is nontoxic-appearing on exam and vital signs are within normal limits.   I have reviewed the triage vital signs and the nursing notes. Pertinent labs &imaging results that were available during my  care of the patient were reviewed by me and considered in my medical decision making (see chart for details).After history, exam, and medical workup I feel the patient has beenappropriately medically screened and is safe for discharge home. Pertinent diagnoses were discussed with the patient. Patient was given return precautions.   Faith Branan, MD 12/27/19 2358

## 2019-12-28 LAB — SARS CORONAVIRUS 2 BY RT PCR (HOSPITAL ORDER, PERFORMED IN ~~LOC~~ HOSPITAL LAB): SARS Coronavirus 2: POSITIVE — AB

## 2020-01-01 DIAGNOSIS — F331 Major depressive disorder, recurrent, moderate: Secondary | ICD-10-CM | POA: Diagnosis not present

## 2020-01-15 DIAGNOSIS — F331 Major depressive disorder, recurrent, moderate: Secondary | ICD-10-CM | POA: Diagnosis not present

## 2020-01-19 ENCOUNTER — Telehealth: Payer: BC Managed Care – PPO | Admitting: Osteopathic Medicine

## 2020-01-22 ENCOUNTER — Telehealth (INDEPENDENT_AMBULATORY_CARE_PROVIDER_SITE_OTHER): Payer: BC Managed Care – PPO | Admitting: Osteopathic Medicine

## 2020-01-22 DIAGNOSIS — Z8616 Personal history of COVID-19: Secondary | ICD-10-CM | POA: Diagnosis not present

## 2020-01-22 DIAGNOSIS — G4489 Other headache syndrome: Secondary | ICD-10-CM | POA: Insufficient documentation

## 2020-01-22 NOTE — Progress Notes (Signed)
Virtual Visit via Video (App used: Doximity) Note  I connected with      Andrea Stephens on 01/22/20 at 9:52 AM  by a telemedicine application and verified that I am speaking with the correct person using two identifiers.  Patient is at home I am in office   I discussed the limitations of evaluation and management by telemedicine and the availability of in person appointments. The patient expressed understanding and agreed to proceed.  History of Present Illness: Andrea Stephens is a 34 y.o. female who would like to discuss headaches.  Patient reports she had COVID about a month ago. Since then she has been having daily headaches until about 2 days ago. She localizes the pain to the back of the head and behind the eyes, stating "it feels like someone hit her in the back of the head." She also endorses some nausea and dizziness with the headaches. She states she initially had some photophobia, but that has resolved. She denies phonophobia, falls, fever, rash, vision changes. She also reports about 3 episodes of right arm numbness, which spontaneously resolved after 2-3 mins. She states these happened at the same time as the headaches, but does not happen with every headache. She denies any weakness with those episodes.  She reports her lamictal dose was recently increased and she recently started on hydroxyzine for anxiety. These were prescribed by her psychiatrist.    Observations/Objective: LMP 12/27/2019  BP Readings from Last 3 Encounters:  12/27/19 120/70  08/18/19 130/72  05/16/19 114/72   Exam: Normal Speech.  Mood and affect appropriate.  Lab and Radiology Results No results found for this or any previous visit (from the past 72 hour(s)). No results found.     Assessment and Plan: 34 y.o. female with The primary encounter diagnosis was Other headache syndrome. A diagnosis of History of COVID-19 was also pertinent to this visit.  Headache: - Given new onset  headaches with associated right arm numbness, in the context of recent COVID infection, I have a low threshold to order an MRI brain, and pt was in agreement with plan for MRI. - No headaches in past two days, so it is possible the headaches will resolve on their own. - Gave patient strict precautions to present to the ED - rare cases of meningitis/encephalitis reported but she seems fairly far out from infection and no clinical s/s of these conditions. - Follow up pending MRI results.  PDMP not reviewed this encounter. Orders Placed This Encounter  Procedures  . MR Brain W Wo Contrast    Order Specific Question:   If indicated for the ordered procedure, I authorize the administration of contrast media per Radiology protocol    Answer:   Yes    Order Specific Question:   What is the patient's sedation requirement?    Answer:   No Sedation    Order Specific Question:   Does the patient have a pacemaker or implanted devices?    Answer:   No    Order Specific Question:   Radiology Contrast Protocol - do NOT remove file path    Answer:   [email or web address]    Order Specific Question:   Preferred imaging location?    Answer:   Product/process development scientist (table limit-350lbs)   No orders of the defined types were placed in this encounter.  There are no Patient Instructions on file for this visit.    Follow Up Instructions: Return for RECHECK PENDING RESULTS / IF  WORSE OR CHANGE.    I discussed the assessment and treatment plan with the patient. The patient was provided an opportunity to ask questions and all were answered. The patient agreed with the plan and demonstrated an understanding of the instructions.   The patient was advised to call back or seek an in-person evaluation if any new concerns, if symptoms worsen or if the condition fails to improve as anticipated.  20 minutes of non-face-to-face time was provided during this  encounter.      . . . . . . . . . . . . . Marland Kitchen                   Historical information moved to improve visibility of documentation.  Past Medical History:  Diagnosis Date  . History of bipolar disorder   . History of seizure   . Seizures ([place])    No past surgical history on file. Social History   Tobacco Use  . Smoking status: Current Every Day Smoker    Packs/day: 0.50    Types: Cigarettes  . Smokeless tobacco: Never Used  Substance Use Topics  . Alcohol use: No   family history includes Alcohol abuse in her father; Cancer in her mother; Depression in her mother; Diabetes in her paternal aunt and paternal uncle.  Medications: Current Outpatient Medications  Medication Sig Dispense Refill  . hydrOXYzine (VISTARIL) 25 MG capsule Take 25 mg by mouth every 6 (six) hours as needed.    . lamoTRIgine (LAMICTAL) 150 MG tablet Take 150 mg by mouth daily.    . QUEtiapine (SEROQUEL) 100 MG tablet Take 1 tablet (100 mg total) by mouth at bedtime. 90 tablet 0   No current facility-administered medications for this visit.   Allergies  Allergen Reactions  . Codeine Hives                                                                     Andrea Stephens, [facility] MS3

## 2020-01-29 DIAGNOSIS — F331 Major depressive disorder, recurrent, moderate: Secondary | ICD-10-CM | POA: Diagnosis not present

## 2020-02-12 DIAGNOSIS — F331 Major depressive disorder, recurrent, moderate: Secondary | ICD-10-CM | POA: Diagnosis not present

## 2020-03-11 DIAGNOSIS — F331 Major depressive disorder, recurrent, moderate: Secondary | ICD-10-CM | POA: Diagnosis not present

## 2020-03-25 DIAGNOSIS — F331 Major depressive disorder, recurrent, moderate: Secondary | ICD-10-CM | POA: Diagnosis not present

## 2020-04-08 DIAGNOSIS — F331 Major depressive disorder, recurrent, moderate: Secondary | ICD-10-CM | POA: Diagnosis not present

## 2020-05-20 DIAGNOSIS — F331 Major depressive disorder, recurrent, moderate: Secondary | ICD-10-CM | POA: Diagnosis not present

## 2020-05-22 DIAGNOSIS — F331 Major depressive disorder, recurrent, moderate: Secondary | ICD-10-CM | POA: Diagnosis not present

## 2020-06-03 DIAGNOSIS — F331 Major depressive disorder, recurrent, moderate: Secondary | ICD-10-CM | POA: Diagnosis not present

## 2020-08-16 DIAGNOSIS — L0591 Pilonidal cyst without abscess: Secondary | ICD-10-CM | POA: Diagnosis not present

## 2020-08-16 DIAGNOSIS — L041 Acute lymphadenitis of trunk: Secondary | ICD-10-CM | POA: Diagnosis not present

## 2020-09-11 DIAGNOSIS — F331 Major depressive disorder, recurrent, moderate: Secondary | ICD-10-CM | POA: Diagnosis not present

## 2020-09-24 DIAGNOSIS — F331 Major depressive disorder, recurrent, moderate: Secondary | ICD-10-CM | POA: Diagnosis not present

## 2020-09-25 DIAGNOSIS — F331 Major depressive disorder, recurrent, moderate: Secondary | ICD-10-CM | POA: Diagnosis not present

## 2020-11-13 ENCOUNTER — Ambulatory Visit: Payer: BC Managed Care – PPO | Admitting: Osteopathic Medicine

## 2020-11-22 DIAGNOSIS — F331 Major depressive disorder, recurrent, moderate: Secondary | ICD-10-CM | POA: Diagnosis not present

## 2020-11-27 ENCOUNTER — Ambulatory Visit: Payer: BC Managed Care – PPO | Admitting: Osteopathic Medicine

## 2020-12-09 DIAGNOSIS — F331 Major depressive disorder, recurrent, moderate: Secondary | ICD-10-CM | POA: Diagnosis not present

## 2020-12-13 ENCOUNTER — Ambulatory Visit (INDEPENDENT_AMBULATORY_CARE_PROVIDER_SITE_OTHER): Payer: BC Managed Care – PPO | Admitting: Osteopathic Medicine

## 2020-12-13 ENCOUNTER — Encounter: Payer: Self-pay | Admitting: Osteopathic Medicine

## 2020-12-13 VITALS — BP 122/59 | HR 74 | Resp 20 | Ht 73.0 in | Wt 142.0 lb

## 2020-12-13 DIAGNOSIS — Z113 Encounter for screening for infections with a predominantly sexual mode of transmission: Secondary | ICD-10-CM | POA: Diagnosis not present

## 2020-12-13 DIAGNOSIS — F319 Bipolar disorder, unspecified: Secondary | ICD-10-CM | POA: Diagnosis not present

## 2020-12-13 DIAGNOSIS — B9689 Other specified bacterial agents as the cause of diseases classified elsewhere: Secondary | ICD-10-CM

## 2020-12-13 DIAGNOSIS — Z0000 Encounter for general adult medical examination without abnormal findings: Secondary | ICD-10-CM | POA: Diagnosis not present

## 2020-12-13 DIAGNOSIS — N760 Acute vaginitis: Secondary | ICD-10-CM

## 2020-12-13 MED ORDER — QUETIAPINE FUMARATE 100 MG PO TABS: 100.0000 mg | ORAL_TABLET | Freq: Every evening | ORAL | 3 refills | Status: DC

## 2020-12-13 MED ORDER — LAMOTRIGINE 150 MG PO TABS: 150.0000 mg | ORAL_TABLET | Freq: Every day | ORAL | 3 refills | Status: DC

## 2020-12-13 MED ORDER — FLUOXETINE HCL 20 MG PO TABS: 20.0000 mg | ORAL_TABLET | Freq: Every day | ORAL | 3 refills | Status: DC

## 2020-12-13 MED ORDER — METRONIDAZOLE 500 MG PO TABS: 500.0000 mg | ORAL_TABLET | Freq: Two times a day (BID) | ORAL | 0 refills | Status: AC

## 2020-12-13 NOTE — Progress Notes (Signed)
Andrea Stephens is a 35 y.o. female who presents to  [place] at [facility]  today, 12/13/20, seeking care for the following:  Annual Physical  Routine STI testing Concern for BV - clear      ASSESSMENT & PLAN with other pertinent findings:  The primary encounter diagnosis was Annual physical exam. Diagnoses of Bipolar I disorder ([place]), BV (bacterial vaginosis), and Routine screening for STI (sexually transmitted infection) were also pertinent to this visit.    Patient Instructions  General Preventive Care Most recent routine screening labs: ordered today.  Blood pressure goal 130/80 or less.  Tobacco: don't! Please let me know if you need help quitting! Alcohol: responsible moderation is ok for most adults - if you have concerns about your alcohol intake, please talk to me!  Exercise: as tolerated to reduce risk of cardiovascular disease and diabetes. Strength training will also prevent osteoporosis.  Mental health: if need for mental health care (adjust medicines, counseling, other), please let me know!  Sexual / Reproductive health: if need for STI testing, or if concerns with libido/pain problems, please let me know! If you need to discuss family planning, please let me know!  Advanced Directive: Living Will and/or Healthcare Power of Attorney recommended for all adults, regardless of age or health.  Vaccines Flu vaccine: for almost everyone, every fall.  Shingles vaccine: after age 18.  Pneumonia vaccines: after age 13. Tetanus booster: every 10 years (due 2027)  HPV vaccine: Gardasil up to age 60  COVID vaccine: [place] screenings  Colon cancer screening: for everyone age 17-75.  Breast cancer screening: mammogram at age 12  Cervical cancer screening: Pap due 03/2021 Lung cancer screening: CT chest every year for those aged 36 to 90 years who have a 20 pack-year smoking history and currently smoke or have  quit within the past 15 years  Infection screenings  HIV: recommended screening at least once age 46-65. Gonorrhea/Chlamydia, other STI: screening as needed Hepatitis C: recommended once for everyone age 28-31 TB: certain at-risk populations, or depending on work requirements and/or travel history Other Bone Density Test: recommended at age 33  Orders Placed This Encounter  Procedures   C. trachomatis/N. gonorrhoeae RNA   Trichomonas vaginalis, RNA   CBC   COMPLETE METABOLIC PANEL WITH GFR   Lipid panel   Hepatitis C antibody   HIV Antibody (routine testing w rflx)   RPR   HSV(herpes simplex vrs) 1+2 ab-IgG    Meds ordered this encounter  Medications   QUEtiapine (SEROQUEL) 100 MG tablet    Sig: Take 1 tablet (100 mg total) by mouth at bedtime.    Dispense:  90 tablet    Refill:  3   lamoTRIgine (LAMICTAL) 150 MG tablet    Sig: Take 1 tablet (150 mg total) by mouth daily.    Dispense:  90 tablet    Refill:  3   metroNIDAZOLE (FLAGYL) 500 MG tablet    Sig: Take 1 tablet (500 mg total) by mouth 2 (two) times daily for 7 days.    Dispense:  14 tablet    Refill:  0   FLUoxetine (PROZAC) 20 MG tablet    Sig: Take 1 tablet (20 mg total) by mouth daily.    Dispense:  90 tablet    Refill:  3     See below for relevant physical exam findings  See below for recent lab and imaging results reviewed  Medications, allergies,  PMH, PSH, SocH, [place] reviewed below    Follow-up instructions: Return in about 1 year (around 12/13/2021) for [place] (call week prior to visit for lab orders), SEE [place] SOONER IF NEEDED. CALL/MESSAGE W/ QUESTIONS.                                        Exam:  BP (!) 122/59 (BP Location: Left Arm, Patient Position: Sitting, Cuff Size: Normal)   Pulse 74   Resp 20   Ht 6\' 1"  (1.854 m)   Wt 142 lb (64.4 kg)   SpO2 100%   BMI 18.73 kg/m  Constitutional: VS see above. General Appearance: alert, well-developed,  well-nourished, NAD Neck: No masses, trachea midline.  Respiratory: Normal respiratory effort. no wheeze, no rhonchi, no rales Cardiovascular: S1/S2 normal, no murmur, no rub/gallop auscultated. RRR.  Musculoskeletal: Gait normal. Symmetric and independent movement of all extremities Abdominal: non-tender, non-distended, no appreciable organomegaly, neg Murphy's, BS [id number] Neurological: Normal balance/coordination. No tremor. Skin: warm, dry, intact.  Psychiatric: Normal judgment/insight. Normal mood and affect. Oriented x3.   Current Meds  Medication Sig   [DISCONTINUED] lamoTRIgine (LAMICTAL) 150 MG tablet Take 150 mg by mouth daily.    Allergies  Allergen Reactions   Codeine Hives    Patient Active Problem List   Diagnosis Date Noted   History of COVID-19 01/22/2020   Other headache syndrome 01/22/2020   Anemia 04/20/2019   Dental infection 11/20/2016   History of substance abuse ([place]) 02/24/2016   Anxiety 02/17/2016   Bipolar I disorder ([place]) 02/17/2016   Breast lump in female 02/17/2016   History of seizure 02/17/2016   Routine screening for STI (sexually transmitted infection) 02/17/2016   Adverse reaction to SSRI (selective serotonin reuptake inhibitor) 02/17/2016    Family History  Problem Relation Age of Onset   Cancer Mother    Depression Mother    Alcohol abuse Father    Diabetes Paternal Aunt    Diabetes Paternal Uncle     Social History   Tobacco Use  Smoking Status Every Day   Packs/day: 0.50   Types: Cigarettes  Smokeless Tobacco Never    No past surgical history on file.  Immunization History  Administered Date(s) Administered   Influenza,inj,Quad PF,6+ Mos 02/10/2018   Tdap 03/04/2016    No results found for this or any previous visit (from the past 2160 hour(s)).  No results found.     All questions at time of visit were answered - patient instructed to contact office with any additional concerns or updates. ER/RTC precautions were  reviewed with the patient as applicable.   Please note: manual typing as well as voice recognition software may have been used to produce this document - typos may escape review. Please contact Dr. Sheppard Coil for any needed clarifications.

## 2020-12-13 NOTE — Patient Instructions (Signed)
General Preventive Care Most recent routine screening labs: ordered today.  Blood pressure goal 130/80 or less.  Tobacco: don't! Please let me know if you need help quitting! Alcohol: responsible moderation is ok for most adults - if you have concerns about your alcohol intake, please talk to me!  Exercise: as tolerated to reduce risk of cardiovascular disease and diabetes. Strength training will also prevent osteoporosis.  Mental health: if need for mental health care (adjust medicines, counseling, other), please let me know!  Sexual / Reproductive health: if need for STI testing, or if concerns with libido/pain problems, please let me know! If you need to discuss family planning, please let me know!  Advanced Directive: Living Will and/or Healthcare Power of Attorney recommended for all adults, regardless of age or health.  Vaccines Flu vaccine: for almost everyone, every fall.  Shingles vaccine: after age 74.  Pneumonia vaccines: after age 42. Tetanus booster: every 10 years (due 2027)  HPV vaccine: Gardasil up to age 10  COVID vaccine: [place] screenings  Colon cancer screening: for everyone age 51-75.  Breast cancer screening: mammogram at age 19  Cervical cancer screening: Pap due 03/2021 Lung cancer screening: CT chest every year for those aged 29 to 89 years who have a 20 pack-year smoking history and currently smoke or have quit within the past 15 years  Infection screenings  HIV: recommended screening at least once age 8-65. Gonorrhea/Chlamydia, other STI: screening as needed Hepatitis C: recommended once for everyone age 04-13 TB: certain at-risk populations, or depending on work requirements and/or travel history Other Bone Density Test: recommended at age 23

## 2020-12-16 LAB — CBC
HCT: 34.7 % — ABNORMAL LOW (ref 35.0–45.0)
Hemoglobin: 11.3 g/dL — ABNORMAL LOW (ref 11.7–15.5)
MCH: 27.3 pg (ref 27.0–33.0)
MCHC: 32.6 g/dL (ref 32.0–36.0)
MCV: 83.8 fL (ref 80.0–100.0)
MPV: 9.6 fL (ref 7.5–12.5)
Platelets: 447 10*3/uL — ABNORMAL HIGH (ref 140–400)
RBC: 4.14 10*6/uL (ref 3.80–5.10)
RDW: 16.6 % — ABNORMAL HIGH (ref 11.0–15.0)
WBC: 7.4 10*3/uL (ref 3.8–10.8)

## 2020-12-16 LAB — C. TRACHOMATIS/N. GONORRHOEAE RNA
C. trachomatis RNA, TMA: NOT DETECTED
N. gonorrhoeae RNA, TMA: NOT DETECTED

## 2020-12-16 LAB — COMPLETE METABOLIC PANEL WITH GFR
AG Ratio: 1.8 (calc) (ref 1.0–2.5)
ALT: 12 U/L (ref 6–29)
AST: 20 U/L (ref 10–30)
Albumin: 4.8 g/dL (ref 3.6–5.1)
Alkaline phosphatase (APISO): 36 U/L (ref 31–125)
BUN: 7 mg/dL (ref 7–25)
CO2: 25 mmol/L (ref 20–32)
Calcium: 9.7 mg/dL (ref 8.6–10.2)
Chloride: 106 mmol/L (ref 98–110)
Creat: 0.86 mg/dL (ref 0.50–0.97)
Globulin: 2.6 g/dL (calc) (ref 1.9–3.7)
Glucose, Bld: 90 mg/dL (ref 65–99)
Potassium: 4.1 mmol/L (ref 3.5–5.3)
Sodium: 138 mmol/L (ref 135–146)
Total Bilirubin: 0.3 mg/dL (ref 0.2–1.2)
Total Protein: 7.4 g/dL (ref 6.1–8.1)
eGFR: 90 mL/min/{1.73_m2} (ref >=60)

## 2020-12-16 LAB — LIPID PANEL
Cholesterol: 147 mg/dL (ref <200)
HDL: 77 mg/dL (ref >=50)
LDL Cholesterol (Calc): 58 mg/dL (calc)
Non-HDL Cholesterol (Calc): 70 mg/dL (calc) (ref <130)
Total CHOL/HDL Ratio: 1.9 (calc) (ref <5.0)
Triglycerides: 41 mg/dL (ref <150)

## 2020-12-16 LAB — HSV(HERPES SIMPLEX VRS) I + II AB-IGG
HAV 1 IGG,TYPE SPECIFIC AB: 50.2 index — ABNORMAL HIGH
HSV 2 IGG,TYPE SPECIFIC AB: <0.9 index

## 2020-12-16 LAB — TRICHOMONAS VAGINALIS, PROBE AMP: Trichomonas vaginalis RNA: DETECTED — AB

## 2020-12-16 LAB — RPR: RPR Ser Ql: NONREACTIVE

## 2020-12-16 LAB — HIV ANTIBODY (ROUTINE TESTING W REFLEX): HIV 1&2 Ab, 4th Generation: NONREACTIVE

## 2020-12-16 LAB — HEPATITIS C ANTIBODY
Hepatitis C Ab: NONREACTIVE
SIGNAL TO CUT-OFF: 0.01 (ref <1.00)

## 2020-12-17 ENCOUNTER — Telehealth: Payer: Self-pay | Admitting: Physician Assistant

## 2020-12-17 DIAGNOSIS — D75839 Thrombocytosis, unspecified: Secondary | ICD-10-CM

## 2020-12-17 DIAGNOSIS — A5901 Trichomonal vulvovaginitis: Secondary | ICD-10-CM | POA: Insufficient documentation

## 2020-12-17 DIAGNOSIS — B009 Herpesviral infection, unspecified: Secondary | ICD-10-CM

## 2020-12-17 NOTE — Telephone Encounter (Signed)
Attempted to contact patient by phone - line busy  [place] sent as below: STI screening positive for vaginal infection due to trichomonas. This is sexually transmitted so you will want to notify current and recent partners (last 2-3 months) so they can be tested and treated. Treatment is with antibiotic you were given for BV called Metronidazole. If you completed this, you should be cured. However, if you have had sex since then, you could be reinfected, so let myself or Dr. Loni Muse know. 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 You can talk to me or Dr. Loni Muse about taking antiviral medication to help prevent outbreaks and lower chance of giving HSV to a sexual partner  Lastly, blood counts show mildly elevated platelets. This is nonspecific and should be re-checked in about 1 month. Please return in August for a lab appt.

## 2021-10-20 ENCOUNTER — Ambulatory Visit (INDEPENDENT_AMBULATORY_CARE_PROVIDER_SITE_OTHER): Payer: BC Managed Care – PPO

## 2021-10-20 ENCOUNTER — Ambulatory Visit
Admission: EM | Admit: 2021-10-20 | Discharge: 2021-10-20 | Disposition: A | Discharge status: Home / Self Care | Payer: BC Managed Care – PPO | Attending: Family Medicine | Admitting: Family Medicine

## 2021-10-20 DIAGNOSIS — R079 Chest pain, unspecified: Secondary | ICD-10-CM | POA: Diagnosis not present

## 2021-10-20 DIAGNOSIS — R0789 Other chest pain: Secondary | ICD-10-CM | POA: Diagnosis not present

## 2021-10-20 DIAGNOSIS — M25512 Pain in left shoulder: Secondary | ICD-10-CM | POA: Diagnosis not present

## 2021-10-20 MED ORDER — CYCLOBENZAPRINE HCL 10 MG PO TABS: 10.0000 mg | ORAL_TABLET | Freq: Two times a day (BID) | ORAL | 0 refills | Status: DC | PRN

## 2021-10-20 NOTE — ED Triage Notes (Signed)
Patient presents to Urgent Care with complaints of cp and shoulder pain L sided since last night . Patient reports they where are work when they began to have crushing cp and diaphoresis, racing hr . Pt st they thought it could be a heart attack.  Pt report pain has been on and off stabbing/ cramping  in nature.

## 2021-10-24 NOTE — ED Provider Notes (Signed)
[facility]    CSN: [id number] Arrival date & time: 10/20/21  1732      History   Chief Complaint Chief Complaint  Patient presents with   Chest Pain   Shoulder Pain    HPI Andrea Stephens is a 36 y.o. female.   Presenting today with 1 day history of left shoulder soreness and left sided chest pain. States there's been two episodes now recently where there will be stabbing and cramping pain in these areas, worse with movement and deep breaths. Sometimes associated with racing heart, denies N/V, HAs, dizziness, visual changes. States episodes last 30 min or so and self resolve. Not trying anything OTC for sxs. No known cardiopulmonary history.    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 No pertinent surgical history.  OB History   No obstetric history on file.      Home Medications    Prior to Admission medications   Medication Sig Start Date End Date Taking? Authorizing Provider  cyclobenzaprine (FLEXERIL) 10 MG tablet Take 1 tablet (10 mg total) by mouth 2 (two) times daily as needed for muscle spasms. Do not drink alcohol or drive while taking this medication.  May cause drowsiness. 10/20/21  Yes Volney American, PA-C  FLUoxetine (PROZAC) 20 MG tablet Take 1  tablet (20 mg total) by mouth daily. 12/13/20   Emeterio Reeve, DO  lamoTRIgine (LAMICTAL) 150 MG tablet Take 1 tablet (150 mg total) by mouth daily. 12/13/20   Emeterio Reeve, DO  QUEtiapine (SEROQUEL) 100 MG tablet Take 1 tablet (100 mg total) by mouth at bedtime. 12/13/20   Emeterio Reeve, DO    Family History Family History  Problem Relation Age of Onset   Cancer Mother    Depression Mother    Alcohol abuse Father    Diabetes Paternal Aunt    Diabetes Paternal Uncle     Social History Social History   Tobacco Use   Smoking status: Every Day    Packs/day: 0.50    Types: Cigarettes   Smokeless tobacco: Never  Substance Use Topics   Alcohol use: No   Drug use: Not Currently    Comment: clean x 2.5 yrs as of 06/12/2018     Allergies   Codeine   Review of Systems Review of Systems PER HPI  Physical Exam Triage Vital Signs ED Triage Vitals [10/20/21 1745]  Enc Vitals Group     BP 106/70     Pulse Rate 78     Resp 18     Temp 98 F (36.7 C)     Temp Source Oral     SpO2 98 %     Weight      Height      Head Circumference  Peak Flow      Pain Score 8     Pain Loc      Pain Edu?      Excl. in [place]?    No data found.  Updated Vital Signs BP 106/70 (BP Location: Right Arm)   Pulse 78   Temp 98 F (36.7 C) (Oral)   Resp 18   SpO2 98%   Visual Acuity Right Eye Distance:   Left Eye Distance:   Bilateral Distance:    Right Eye Near:   Left Eye Near:    Bilateral Near:     Physical Exam Vitals and nursing note reviewed.  Constitutional:      Appearance: Normal appearance. She is not ill-appearing.  HENT:     Head: Atraumatic.  Eyes:     Extraocular Movements: Extraocular movements intact.     Conjunctiva/sclera: Conjunctivae normal.  Cardiovascular:     Rate and Rhythm: Normal rate and regular rhythm.     Heart sounds: Normal heart sounds.  Pulmonary:     Effort: Pulmonary effort is normal.     Breath sounds: Normal breath  sounds.     Comments: Left pectoral and left anterior shoulder ttp extending down toward left superior anterior ribs, reproducing pain sxs. Chest:     Chest wall: Tenderness present.  Abdominal:     General: Bowel sounds are normal. There is no distension.     Palpations: Abdomen is soft.     Tenderness: There is no abdominal tenderness. There is no guarding.  Musculoskeletal:        General: Normal range of motion.     Cervical back: Normal range of motion and neck supple.  Skin:    General: Skin is warm and dry.  Neurological:     Mental Status: She is alert and oriented to person, place, and time.  Psychiatric:        Mood and Affect: Mood normal.        Thought Content: Thought content normal.        Judgment: Judgment normal.     UC Treatments / Results  Labs (all labs ordered are listed, but only abnormal results are displayed) Labs Reviewed - No data to display  EKG   Radiology No results found.  Procedures Procedures (including critical care time)  Medications Ordered in UC Medications - No data to display  Initial Impression / Assessment and Plan / UC Course  I have reviewed the triage vital signs and the nursing notes.  Pertinent labs & imaging results that were available during my care of the patient were reviewed by me and considered in my medical decision making (see chart for details).     VS benign and reassuring, exam reassuring and most consistent with MSK chest pain, EKG NSR with no acute ST or T wave changes. Suspect MSK, possibly inducing panic episodes at onset. Treat with naproxen, flexeril, stretches, rest. ED for worsening sxs at any time, PCP f/u for recheck.   Final Clinical Impressions(s) / UC Diagnoses   Final diagnoses:  Chest wall pain  Acute pain of left shoulder   Discharge Instructions   None    ED Prescriptions     Medication Sig Dispense Auth. Provider   cyclobenzaprine (FLEXERIL) 10 MG tablet Take 1 tablet (10 mg total)  by mouth 2 (two) times daily as needed for muscle spasms. Do not drink alcohol or drive while taking this medication.  May cause drowsiness. 10 tablet Volney American, [place]  PDMP not reviewed this encounter.   Volney American, [place] 10/24/21 2322

## 2021-12-15 ENCOUNTER — Other Ambulatory Visit: Payer: Self-pay

## 2021-12-15 ENCOUNTER — Other Ambulatory Visit: Payer: Self-pay | Admitting: Osteopathic Medicine

## 2021-12-15 DIAGNOSIS — B9689 Other specified bacterial agents as the cause of diseases classified elsewhere: Secondary | ICD-10-CM

## 2021-12-15 DIAGNOSIS — F319 Bipolar disorder, unspecified: Secondary | ICD-10-CM

## 2021-12-15 MED ORDER — FLUOXETINE HCL 20 MG PO TABS: 20.0000 mg | ORAL_TABLET | Freq: Every day | ORAL | 0 refills | Status: DC

## 2021-12-15 MED ORDER — LAMOTRIGINE 150 MG PO TABS: 150.0000 mg | ORAL_TABLET | Freq: Every day | ORAL | 0 refills | Status: DC

## 2021-12-15 MED ORDER — QUETIAPINE FUMARATE 100 MG PO TABS: 100.0000 mg | ORAL_TABLET | Freq: Every evening | ORAL | 0 refills | Status: DC

## 2022-01-22 ENCOUNTER — Other Ambulatory Visit: Payer: Self-pay

## 2022-01-22 ENCOUNTER — Emergency Department: Payer: BC Managed Care – PPO

## 2022-01-22 ENCOUNTER — Emergency Department
Admission: EM | Admit: 2022-01-22 | Discharge: 2022-01-22 | Disposition: A | Discharge status: Home / Self Care | Payer: BC Managed Care – PPO | Attending: Emergency Medicine | Admitting: Emergency Medicine

## 2022-01-22 ENCOUNTER — Encounter: Payer: Self-pay | Admitting: Emergency Medicine

## 2022-01-22 DIAGNOSIS — S0990XA Unspecified injury of head, initial encounter: Secondary | ICD-10-CM | POA: Diagnosis not present

## 2022-01-22 DIAGNOSIS — R109 Unspecified abdominal pain: Secondary | ICD-10-CM | POA: Insufficient documentation

## 2022-01-22 DIAGNOSIS — R55 Syncope and collapse: Secondary | ICD-10-CM | POA: Insufficient documentation

## 2022-01-22 DIAGNOSIS — R197 Diarrhea, unspecified: Secondary | ICD-10-CM | POA: Insufficient documentation

## 2022-01-22 LAB — CBC WITH DIFFERENTIAL/PLATELET
Abs Immature Granulocytes: 0.03 10*3/uL (ref 0.00–0.07)
Basophils Absolute: 0.1 10*3/uL (ref 0.0–0.1)
Basophils Relative: 0 %
Eosinophils Absolute: 0.1 10*3/uL (ref 0.0–0.5)
Eosinophils Relative: 1 %
HCT: 30.7 % — ABNORMAL LOW (ref 36.0–46.0)
Hemoglobin: 9.6 g/dL — ABNORMAL LOW (ref 12.0–15.0)
Immature Granulocytes: 0 %
Lymphocytes Relative: 11 %
Lymphs Abs: 1.2 10*3/uL (ref 0.7–4.0)
MCH: 24.5 pg — ABNORMAL LOW (ref 26.0–34.0)
MCHC: 31.3 g/dL (ref 30.0–36.0)
MCV: 78.3 fL — ABNORMAL LOW (ref 80.0–100.0)
Monocytes Absolute: 0.5 10*3/uL (ref 0.1–1.0)
Monocytes Relative: 5 %
Neutro Abs: 9.5 10*3/uL — ABNORMAL HIGH (ref 1.7–7.7)
Neutrophils Relative %: 83 %
Platelets: 386 10*3/uL (ref 150–400)
RBC: 3.92 MIL/uL (ref 3.87–5.11)
RDW: 16.2 % — ABNORMAL HIGH (ref 11.5–15.5)
WBC: 11.4 10*3/uL — ABNORMAL HIGH (ref 4.0–10.5)
nRBC: 0 % (ref 0.0–0.2)

## 2022-01-22 LAB — URINALYSIS, ROUTINE W REFLEX MICROSCOPIC
Bilirubin Urine: NEGATIVE
Glucose, UA: NEGATIVE mg/dL
Hgb urine dipstick: NEGATIVE
Ketones, ur: NEGATIVE mg/dL
Leukocytes,Ua: NEGATIVE
Nitrite: NEGATIVE
Protein, ur: >=300 mg/dL — AB
Specific Gravity, Urine: 1.027 (ref 1.005–1.030)
pH: 9 — ABNORMAL HIGH (ref 5.0–8.0)

## 2022-01-22 LAB — BASIC METABOLIC PANEL
Anion gap: 9 (ref 5–15)
BUN: 11 mg/dL (ref 6–20)
CO2: 22 mmol/L (ref 22–32)
Calcium: 9 mg/dL (ref 8.9–10.3)
Chloride: 107 mmol/L (ref 98–111)
Creatinine, Ser: 0.92 mg/dL (ref 0.44–1.00)
GFR, Estimated: >60 mL/min (ref >60)
Glucose, Bld: 145 mg/dL — ABNORMAL HIGH (ref 70–99)
Potassium: 3.3 mmol/L — ABNORMAL LOW (ref 3.5–5.1)
Sodium: 138 mmol/L (ref 135–145)

## 2022-01-22 LAB — TROPONIN I (HIGH SENSITIVITY): Troponin I (High Sensitivity): 3 ng/L (ref <18)

## 2022-01-22 LAB — POC URINE PREG, ED: Preg Test, Ur: NEGATIVE

## 2022-01-22 MED ORDER — SODIUM CHLORIDE 0.9 % IV BOLUS
1000.0000 mL | Freq: Once | INTRAVENOUS | Status: AC
  Administered 2022-01-22: 1000 mL via INTRAVENOUS

## 2022-01-22 MED ORDER — POTASSIUM CHLORIDE CRYS ER 20 MEQ PO TBCR
40.0000 meq | EXTENDED_RELEASE_TABLET | Freq: Once | ORAL | Status: AC
  Administered 2022-01-22: 40 meq via ORAL
  Filled 2022-01-22: qty 2

## 2022-01-22 MED ORDER — DICYCLOMINE HCL 10 MG PO CAPS: 10.0000 mg | ORAL_CAPSULE | Freq: Three times a day (TID) | ORAL | 0 refills | Status: DC

## 2022-01-22 MED ORDER — ONDANSETRON 4 MG PO TBDP: 4.0000 mg | ORAL_TABLET | Freq: Three times a day (TID) | ORAL | 0 refills | Status: AC | PRN

## 2022-01-22 NOTE — ED Triage Notes (Signed)
Pt presents to ED with c/o of syncopal episodes PTA. Pt states first time he was found by his GF and second time he woke up on his own. Pt ambulatory with steady gait to triage

## 2022-01-22 NOTE — ED Provider Notes (Signed)
[facility] Provider Note  Patient Contact: 5:02 PM (approximate)   History   Loss of Consciousness   HPI  Andrea Stephens is a 36 y.o. female with history of seizures, bipolar disorder and depression, presents to the emergency department after patient had 2 episodes of syncope.  Patient states that 2 family members in her home have COVID-19 when she awoke with abdominal cramping and diarrhea.  Patient states that she passed out while on the toilet but remembers intense pain before syncope.  Patient states that her second episode of syncope today was also when she was having diarrhea.  Denies a history of syncope in the past.  States that she is not currently having any abdominal pain.  No chest pain, chest tightness, shortness of breath or vomiting.  No fever      Physical Exam   Triage Vital Signs: ED Triage Vitals  Enc Vitals Group     BP 01/22/22 1432 90/62     Pulse Rate 01/22/22 1432 94     Resp 01/22/22 1432 18     Temp 01/22/22 1432 98.4 F (36.9 C)     Temp Source 01/22/22 1432 Oral     SpO2 01/22/22 1432 96 %     Weight 01/22/22 1634 141 lb 15.6 oz (64.4 kg)     Height 01/22/22 1634 '6\' 1"'$  (1.854 m)     Head Circumference --      Peak Flow --      Pain Score 01/22/22 1434 7     Pain Loc --      Pain Edu? --      Excl. in [place]? --     Most recent vital signs: Vitals:   01/22/22 1432  BP: 90/62  Pulse: 94  Resp: 18  Temp: 98.4 F (36.9 C)  SpO2: 96%     General: Alert and in no acute distress. Eyes:  PERRL. EOMI. Head: No acute traumatic findings ENT:      Nose: No congestion/rhinnorhea.      Mouth/Throat: Mucous membranes are moist.  Neck: No stridor. No cervical spine tenderness to palpation. Cardiovascular:  Good peripheral perfusion Respiratory: Normal respiratory effort without tachypnea or retractions. Lungs CTAB. Good air entry to the bases with no decreased or absent breath sounds. Gastrointestinal: Bowel sounds 4  quadrants. Soft and nontender to palpation. No guarding or rigidity. No palpable masses. No distention. No CVA tenderness. Musculoskeletal: Full range of motion to all extremities.  Neurologic:  No gross focal neurologic deficits are appreciated.  Skin:   No rash noted    ED Results / Procedures / Treatments   Labs (all labs ordered are listed, but only abnormal results are displayed) Labs Reviewed  CBC WITH DIFFERENTIAL/PLATELET - Abnormal; Notable for the following components:      Result Value   WBC 11.4 (*)    Hemoglobin 9.6 (*)    HCT 30.7 (*)    MCV 78.3 (*)    MCH 24.5 (*)    RDW 16.2 (*)    Neutro Abs 9.5 (*)    All other components within normal limits  BASIC METABOLIC PANEL - Abnormal; Notable for the following components:   Potassium 3.3 (*)    Glucose, Bld 145 (*)    All other components within normal limits  URINALYSIS, ROUTINE W REFLEX MICROSCOPIC - Abnormal; Notable for the following components:   Color, Urine YELLOW (*)    APPearance HAZY (*)    pH 9.0 (*)  Protein, ur >=300 (*)    Bacteria, UA RARE (*)    All other components within normal limits  POC URINE PREG, ED  TROPONIN I (HIGH SENSITIVITY)  TROPONIN I (HIGH SENSITIVITY)     EKG  Normal sinus rhythm with flattened T waves.  No ST segment elevation or other apparent arrhythmia.   RADIOLOGY  I personally viewed and evaluated these images as part of my medical decision making, as well as reviewing the written report by the radiologist.  ED Provider Interpretation: No evidence of intracranial bleed or skull fracture on dedicated CT.   PROCEDURES:  Critical Care performed: No  Procedures   MEDICATIONS ORDERED IN ED: Medications  potassium chloride SA (KLOR-CON M) CR tablet 40 mEq (has no administration in time range)     IMPRESSION / MDM / ASSESSMENT AND PLAN / ED COURSE  I reviewed the triage vital signs and the nursing notes.                              Assessment and  plan Syncope 36 year old female presents to the emergency department after 2 episodes of syncope while having diarrhea.  Patient was mildly hypotensive at triage but vital signs were otherwise reassuring.  Patient was alert, active and nontoxic-appearing with no increased work of breathing.   CBC indicated mildly elevated white blood cell count and decrease in hemoglobin from 11-9.6 today.  Troponin within range.  Urinalysis shows proteinuria but no other acute abnormality.  CT head unremarkable.  Suspect vasovagal syncope secondary to abdominal cramping from diarrhea and possible COVID infection as multiple family members in the home are currently infected.  I discharge patient with Bentyl and Zofran for her nausea and abdominal cramping with strict return precautions to return to the emergency department with new or worsening symptoms.   FINAL CLINICAL IMPRESSION(S) / ED DIAGNOSES   Final diagnoses:  Syncope and collapse     Rx / DC Orders   ED Discharge Orders          Ordered    ondansetron (ZOFRAN-ODT) 4 MG disintegrating tablet  Every 8 hours PRN        01/22/22 1700    dicyclomine (BENTYL) 10 MG capsule  3 times daily before meals & bedtime        01/22/22 1700             Note:  This document was prepared using Dragon voice recognition software and may include unintentional dictation errors.   Vallarie Mare [place], PA-C 01/22/22 1708    Naaman Plummer, MD 01/22/22 [phone]

## 2022-01-22 NOTE — ED Provider Triage Note (Signed)
Emergency Medicine Provider Triage Evaluation Note  Andrea Stephens , a 36 y.o. female  was evaluated in triage.  Pt complains of syncope x2. First time patient reports that the patient was found by his girlfriend in the bathtub. Second time went to use the bathroom and reached for toilet paper and got tunnel vision and ear ringing and think he passed out for 2-3 min. Denies substance use. Denies tongue biting or incontinence. Reports hit her head and has headache.History of seizure when was doing drugs but reports she hasnt used drugs in 6 years.  Review of Systems  Positive: syncope Negative: CP/SOB/palpitations/abdominal pain  Physical Exam  BP 90/62 (BP Location: Right Arm)   Pulse 94   Temp 98.4 F (36.9 C) (Oral)   Resp 18   SpO2 96%  Gen:   Awake, no distress   Resp:  Normal effort  MSK:   Moves extremities without difficulty  Other:    Medical Decision Making  Medically screening exam initiated at 2:36 PM.  Appropriate orders placed.  Andrea Stephens was informed that the remainder of the evaluation will be completed by another provider, this initial triage assessment does not replace that evaluation, and the importance of remaining in the ED until their evaluation is complete.     Marquette Old, PA-C 01/22/22 1436

## 2022-01-22 NOTE — Discharge Instructions (Addendum)
You can take Zofran and Bentyl for abdominal spasms.

## 2022-02-05 ENCOUNTER — Ambulatory Visit: Payer: BC Managed Care – PPO | Admitting: Medical-Surgical

## 2022-03-12 ENCOUNTER — Other Ambulatory Visit: Payer: Self-pay | Admitting: Sports Medicine

## 2022-03-12 DIAGNOSIS — F319 Bipolar disorder, unspecified: Secondary | ICD-10-CM

## 2022-04-20 ENCOUNTER — Other Ambulatory Visit: Payer: Self-pay | Admitting: Sports Medicine

## 2022-04-20 ENCOUNTER — Ambulatory Visit: Payer: BC Managed Care – PPO | Admitting: Family Medicine

## 2022-04-20 VITALS — BP 115/63 | HR 85 | Ht 73.0 in | Wt 150.0 lb

## 2022-04-20 DIAGNOSIS — F319 Bipolar disorder, unspecified: Secondary | ICD-10-CM | POA: Diagnosis not present

## 2022-04-20 MED ORDER — FLUOXETINE HCL 20 MG PO TABS: 20.0000 mg | ORAL_TABLET | Freq: Every day | ORAL | 0 refills | Status: DC

## 2022-04-20 MED ORDER — QUETIAPINE FUMARATE 100 MG PO TABS: 100.0000 mg | ORAL_TABLET | Freq: Every evening | ORAL | 2 refills | Status: DC

## 2022-04-20 MED ORDER — LAMOTRIGINE 150 MG PO TABS: 150.0000 mg | ORAL_TABLET | Freq: Every day | ORAL | 2 refills | Status: DC

## 2022-04-20 NOTE — Progress Notes (Signed)
Established patient visit   Patient: Andrea Stephens   DOB: June 30, 1985   36 y.o. Female  MRN: [id number] Visit Date: 04/20/2022  Today's healthcare provider: Owens Loffler, DO   Chief Complaint  Patient presents with   Medication Refill    SUBJECTIVE    Chief Complaint  Patient presents with   Medication Refill   HPI  Pt presents for medication refills. She is requesting refill for her prozac '20mg'$ , seroquel '100mg'$ , and lamictal '150mg'$ . She has been out of medications for about a month. She follows with the [facility] in [place]. She has never followed with psychiatry.   Review of Systems  Constitutional:  Negative for activity change, fatigue and fever.  Respiratory:  Negative for cough and shortness of breath.   Cardiovascular:  Negative for chest pain.  Gastrointestinal:  Negative for abdominal pain.  Genitourinary:  Negative for difficulty urinating.       Current Meds  Medication Sig   cyclobenzaprine (FLEXERIL) 10 MG tablet Take 1 tablet (10 mg total) by mouth 2 (two) times daily as needed for muscle spasms. Do not drink alcohol or drive while taking this medication.  May cause drowsiness.   [DISCONTINUED] FLUoxetine (PROZAC) 20 MG tablet Take 1 tablet (20 mg total) by mouth daily.   [DISCONTINUED] lamoTRIgine (LAMICTAL) 150 MG tablet Take 1 tablet (150 mg total) by mouth daily.   [DISCONTINUED] QUEtiapine (SEROQUEL) 100 MG tablet Take 1 tablet (100 mg total) by mouth at bedtime.    OBJECTIVE    BP 115/63   Pulse 85   Ht '6\' 1"'$  (1.854 m)   Wt 150 lb (68 kg)   SpO2 100%   BMI 19.79 kg/m   Physical Exam Vitals and nursing note reviewed.  Constitutional:      General: She is not in acute distress.    Appearance: Normal appearance.  HENT:     Head: Normocephalic and atraumatic.     Right Ear: External ear normal.     Left Ear: External ear normal.     Nose: Nose normal.  Eyes:     Conjunctiva/sclera: Conjunctivae normal.  Cardiovascular:      Rate and Rhythm: Normal rate and regular rhythm.  Pulmonary:     Effort: Pulmonary effort is normal.     Breath sounds: Normal breath sounds.  Neurological:     General: No focal deficit present.     Mental Status: She is alert and oriented to person, place, and time.  Psychiatric:        Mood and Affect: Mood normal.        Behavior: Behavior normal.        Thought Content: Thought content normal.        Judgment: Judgment normal.          ASSESSMENT & PLAN    Problem List Items Addressed This Visit       Other   Bipolar I disorder ([place]) - Primary    - pt says she is doing well on this regimen. Will order a UPT  - UPT negative, will go ahead and send refills in of medication - have sent referral to psychiatry      Relevant Medications   FLUoxetine (PROZAC) 20 MG tablet   lamoTRIgine (LAMICTAL) 150 MG tablet   QUEtiapine (SEROQUEL) 100 MG tablet   Other Relevant Orders   Ambulatory referral to Psychiatry    No follow-ups on file.      Meds ordered  this encounter  Medications   FLUoxetine (PROZAC) 20 MG tablet    Sig: Take 1 tablet (20 mg total) by mouth daily.    Dispense:  90 tablet    Refill:  0   lamoTRIgine (LAMICTAL) 150 MG tablet    Sig: Take 1 tablet (150 mg total) by mouth daily.    Dispense:  30 tablet    Refill:  2   QUEtiapine (SEROQUEL) 100 MG tablet    Sig: Take 1 tablet (100 mg total) by mouth at bedtime.    Dispense:  30 tablet    Refill:  2    Orders Placed This Encounter  Procedures   Ambulatory referral to Psychiatry    Referral Priority:   Routine    Referral Type:   Psychiatric    Referral Reason:   Specialty Services Required    Requested Specialty:   Psychiatry    Number of Visits Requested:   [place], [place] [phone] (phone) [phone] (fax)  [place]

## 2022-04-20 NOTE — Assessment & Plan Note (Addendum)
-   pt says she is doing well on this regimen. Will order a UPT  - UPT negative, will go ahead and send refills in of medication - have sent referral to psychiatry

## 2022-05-12 ENCOUNTER — Other Ambulatory Visit: Payer: Self-pay | Admitting: Family Medicine

## 2022-05-12 DIAGNOSIS — F319 Bipolar disorder, unspecified: Secondary | ICD-10-CM

## 2022-06-07 ENCOUNTER — Encounter (HOSPITAL_COMMUNITY): Payer: Self-pay

## 2022-06-07 ENCOUNTER — Ambulatory Visit (HOSPITAL_COMMUNITY)
Admission: EM | Admit: 2022-06-07 | Discharge: 2022-06-07 | Disposition: A | Discharge status: Home / Self Care | Payer: BC Managed Care – PPO | Attending: Emergency Medicine | Admitting: Emergency Medicine

## 2022-06-07 DIAGNOSIS — K029 Dental caries, unspecified: Secondary | ICD-10-CM | POA: Diagnosis not present

## 2022-06-07 DIAGNOSIS — K047 Periapical abscess without sinus: Secondary | ICD-10-CM

## 2022-06-07 MED ORDER — AMOXICILLIN-POT CLAVULANATE 875-125 MG PO TABS: 1.0000 | ORAL_TABLET | Freq: Two times a day (BID) | ORAL | 0 refills | Status: DC

## 2022-06-07 MED ORDER — CHLORHEXIDINE GLUCONATE 0.12 % MT SOLN: OROMUCOSAL | 0 refills | Status: DC

## 2022-06-07 MED ORDER — LIDOCAINE VISCOUS HCL 2 % MT SOLN
15.0000 mL | Freq: Once | OROMUCOSAL | Status: AC
  Administered 2022-06-07: 15 mL via OROMUCOSAL

## 2022-06-07 MED ORDER — NAPROXEN 500 MG PO TABS: 500.0000 mg | ORAL_TABLET | Freq: Two times a day (BID) | ORAL | 0 refills | Status: DC

## 2022-06-07 NOTE — ED Triage Notes (Signed)
Pt is here for dental abscess x. Pt reports pain and pressure.

## 2022-06-07 NOTE — Discharge Instructions (Addendum)
Look up the [place] [facility] for free dental clinics. undoomedical.com.asp  Get there early and be prepared to wait. [employer] and [employer] have Copywriter, advertising schools that provide low cost routine dental care.   Other resources: [facility] [place], [place] [phone]  Patients with Medicaid: [place] [address] Phone:  [phone]                                                  Phone:  [phone]  Dr. Ardyth Harps [address] [phone]    [facility] [phone] extension [phone] [address]   [place] [phone] [address] [place].  Rescue mission [phone] extension [address] [place], [place], [address] First come first serve for the first 10 clients.  May do simple extractions only, no wisdom teeth or surgery.  You may try the second for Thursday of the month starting at [place] of Dentistry You may call the school to see if they are still helping to provide dental care for emergent cases.  If unable to pay or uninsured, contact:  [place] or [facility] to become qualified for the adult dental clinic.  No matter what dental problem you have, it will not get better unless you get good dental care.  If the tooth is not taken care of, your symptoms will come back in time and you will be visiting [place] again in the [facility]gent [place] with a bad toothache.  So, see your dentist as soon as possible.  If you don't have a dentist, we can give you a list of dentists.  Sometimes the most cost effective treatment is removal of the tooth.  This can be done very inexpensively through one of the low cost Dance movement psychotherapist such as the facility on [facility] in [place] [phone]).  The  downside to this is that you will have one less tooth and this can effect your ability to chew.  Some other things that can be done for a dental infection include the following:  Rinse your mouth out with hot salt water (1/2 tsp of table salt and a pinch of baking soda in 8 oz of hot water).  You can do this every 2 or 3 hours. Avoid cold foods, beverages, and cold air.  This will make your symptoms worse. Sleep with your head elevated.  Sleeping flat will cause your gums and oral tissues to swell and make them hurt more.  You can sleep on several pillows.  Even better is to sleep in a recliner with your head higher than your heart. For mild to moderate pain, you can take Tylenol, ibuprofen, or Aleve. External application of heat by a heating pad, hot water bottle, or hot wet towel can help with pain and speed healing.  You can do this every 2 to 3 hours. Do not fall asleep on a  heating pad since this can cause a burn.   Go to www.goodrx.com to look up your medications. This will give you a list of where you can find your prescriptions at the most affordable prices. Or ask the pharmacist what the cash price is, or if they have any other discount programs available to help make your medication more affordable. This can be less expensive than what you would pay with insurance.

## 2022-06-07 NOTE — ED Provider Notes (Signed)
HPI  SUBJECTIVE:  Andrea Stephens is a 37 y.o. female who presents with 2 days of constant, throbbing, right lower dental pain where she has a bad tooth.  She reports gingival swelling and cervical lymphadenopathy.  No fevers, facial swelling, swelling under tongue, neck stiffness, trismus.  No trauma to the tooth.  She has tried ibuprofen alternating with Tylenol, salt water rinses, Orajel, cold compresses and baking soda.  Salt water rinses help temporarily.  Symptoms worse with exposure to air and p.o. intake.  She has a past medical history of bipolar disorder and seizures.  PCP: [facility] [place].  Dentist: None.    Past Medical History:  Diagnosis Date   History of bipolar disorder    History of seizure    Seizures ([place])     History reviewed. No pertinent surgical history.  Family History  Problem Relation Age of Onset   Cancer Mother    Depression Mother    Alcohol abuse Father    Diabetes Paternal Aunt    Diabetes Paternal Uncle     Social History   Tobacco Use   Smoking status: Every Day    Packs/day: 0.50    Types: Cigarettes   Smokeless tobacco: Never  Substance Use Topics   Alcohol use: No   Drug use: Not Currently    Comment: clean x 2.5 yrs as of 06/12/2018     Current Facility-Administered Medications:    lidocaine (XYLOCAINE) 2 % viscous mouth solution 15 mL, 15 mL, Mouth/Throat, Once, Melynda Ripple, MD  Current Outpatient Medications:    amoxicillin-clavulanate (AUGMENTIN) 875-125 MG tablet, Take 1 tablet by mouth every 12 (twelve) hours., Disp: 14 tablet, Rfl: 0   chlorhexidine (PERIDEX) 0.12 % solution, 15 mL swish and spit bid, Disp: 480 mL, Rfl: 0   naproxen (NAPROSYN) 500 MG tablet, Take 1 tablet (500 mg total) by mouth 2 (two) times daily., Disp: 20 tablet, Rfl: 0   cyclobenzaprine (FLEXERIL) 10 MG tablet, Take 1 tablet (10 mg total) by mouth 2 (two) times daily as needed for muscle spasms. Do not drink alcohol or drive while taking  this medication.  May cause drowsiness., Disp: 10 tablet, Rfl: 0   dicyclomine (BENTYL) 10 MG capsule, Take 1 capsule (10 mg total) by mouth 4 (four) times daily -  before meals and at bedtime for 5 days., Disp: 20 capsule, Rfl: 0   FLUoxetine (PROZAC) 20 MG tablet, Take 1 tablet (20 mg total) by mouth daily., Disp: 90 tablet, Rfl: 0   lamoTRIgine (LAMICTAL) 150 MG tablet, Take 1 tablet (150 mg total) by mouth daily., Disp: 30 tablet, Rfl: 2   QUEtiapine (SEROQUEL) 100 MG tablet, TAKE 1 TABLET BY MOUTH EVERYDAY AT BEDTIME, Disp: 90 tablet, Rfl: 1  Allergies  Allergen Reactions   Codeine Hives     ROS  As noted in HPI.   Physical Exam  BP 128/70 (BP Location: Left Arm)   Pulse 82   Temp 98.4 F (36.9 C) (Oral)   Resp 16   SpO2 100%   Constitutional: Well developed, well nourished, no acute distress Eyes:  EOMI, conjunctiva normal bilaterally HENT: Normocephalic, atraumatic,mucus membranes moist.  Tooth 31, 32, second and third molar decayed, tender to palpation with surrounding gingival swelling.  No expressible purulent drainage.  No swelling under the tongue, facial swelling.  No drooling, trismus. Neck: Positive right-sided cervical lymphadenopathy. Respiratory: Normal inspiratory effort Cardiovascular: Normal rate GI: nondistended skin: No rash, skin intact Musculoskeletal: no deformities Neurologic: Alert &  oriented x 3, no focal neuro deficits Psychiatric: Speech and behavior appropriate   ED Course   Medications  lidocaine (XYLOCAINE) 2 % viscous mouth solution 15 mL (has no administration in time range)    No orders of the defined types were placed in this encounter.   No results found for this or any previous visit (from the past 24 hour(s)). No results found.  ED Clinical Impression  1. Dental infection   2. Pain due to dental caries      ED Assessment/Plan     Presentation consistent with dental abscess.  Tooth is too far back for me to  comfortably do a dental block.  Gave viscous lidocaine here.  Unable to prescribe narcotics as patient is a recovering opioid addict.  Home with Augmentin, Naprosyn/Tylenol, clove oil, salt water rinses, Peridex or Listerine rinses.  Providing dental list.  ER return precautions given.   Discussed MDM, treatment plan, and plan for follow-up with patient. Discussed sn/sx that should prompt return to the ED. patient agrees with plan.   Meds ordered this encounter  Medications   lidocaine (XYLOCAINE) 2 % viscous mouth solution 15 mL   naproxen (NAPROSYN) 500 MG tablet    Sig: Take 1 tablet (500 mg total) by mouth 2 (two) times daily.    Dispense:  20 tablet    Refill:  0   amoxicillin-clavulanate (AUGMENTIN) 875-125 MG tablet    Sig: Take 1 tablet by mouth every 12 (twelve) hours.    Dispense:  14 tablet    Refill:  0   chlorhexidine (PERIDEX) 0.12 % solution    Sig: 15 mL swish and spit bid    Dispense:  480 mL    Refill:  0      *This clinic note was created using [employer]. Therefore, there may be occasional mistakes despite careful proofreading.  ?    Melynda Ripple, MD 06/08/22 [phone]

## 2022-09-21 DIAGNOSIS — F331 Major depressive disorder, recurrent, moderate: Secondary | ICD-10-CM | POA: Diagnosis not present

## 2022-09-28 DIAGNOSIS — F331 Major depressive disorder, recurrent, moderate: Secondary | ICD-10-CM | POA: Diagnosis not present

## 2022-10-05 DIAGNOSIS — F331 Major depressive disorder, recurrent, moderate: Secondary | ICD-10-CM | POA: Diagnosis not present

## 2022-10-14 ENCOUNTER — Other Ambulatory Visit: Payer: Self-pay | Admitting: Family Medicine

## 2022-10-14 DIAGNOSIS — F319 Bipolar disorder, unspecified: Secondary | ICD-10-CM

## 2022-11-02 DIAGNOSIS — F331 Major depressive disorder, recurrent, moderate: Secondary | ICD-10-CM | POA: Diagnosis not present

## 2022-11-09 DIAGNOSIS — F331 Major depressive disorder, recurrent, moderate: Secondary | ICD-10-CM | POA: Diagnosis not present

## 2022-11-23 DIAGNOSIS — F331 Major depressive disorder, recurrent, moderate: Secondary | ICD-10-CM | POA: Diagnosis not present

## 2022-11-30 DIAGNOSIS — F331 Major depressive disorder, recurrent, moderate: Secondary | ICD-10-CM | POA: Diagnosis not present

## 2022-12-05 ENCOUNTER — Other Ambulatory Visit: Payer: Self-pay | Admitting: Family Medicine

## 2022-12-05 DIAGNOSIS — F319 Bipolar disorder, unspecified: Secondary | ICD-10-CM

## 2022-12-10 ENCOUNTER — Ambulatory Visit (INDEPENDENT_AMBULATORY_CARE_PROVIDER_SITE_OTHER): Payer: BC Managed Care – PPO | Admitting: Family Medicine

## 2022-12-10 DIAGNOSIS — Z91199 Patient's noncompliance with other medical treatment and regimen due to unspecified reason: Secondary | ICD-10-CM

## 2022-12-10 NOTE — Progress Notes (Signed)
No show

## 2022-12-11 ENCOUNTER — Ambulatory Visit (INDEPENDENT_AMBULATORY_CARE_PROVIDER_SITE_OTHER): Payer: BC Managed Care – PPO | Admitting: Family Medicine

## 2022-12-11 DIAGNOSIS — Z91199 Patient's noncompliance with other medical treatment and regimen due to unspecified reason: Secondary | ICD-10-CM

## 2022-12-11 NOTE — Progress Notes (Signed)
No show

## 2022-12-14 DIAGNOSIS — F331 Major depressive disorder, recurrent, moderate: Secondary | ICD-10-CM | POA: Diagnosis not present

## 2022-12-28 DIAGNOSIS — F331 Major depressive disorder, recurrent, moderate: Secondary | ICD-10-CM | POA: Diagnosis not present

## 2023-01-04 DIAGNOSIS — F331 Major depressive disorder, recurrent, moderate: Secondary | ICD-10-CM | POA: Diagnosis not present

## 2023-01-18 DIAGNOSIS — F331 Major depressive disorder, recurrent, moderate: Secondary | ICD-10-CM | POA: Diagnosis not present

## 2023-02-18 ENCOUNTER — Ambulatory Visit (INDEPENDENT_AMBULATORY_CARE_PROVIDER_SITE_OTHER): Payer: BC Managed Care – PPO | Admitting: Family Medicine

## 2023-02-18 ENCOUNTER — Encounter: Payer: Self-pay | Admitting: Family Medicine

## 2023-02-18 DIAGNOSIS — F319 Bipolar disorder, unspecified: Secondary | ICD-10-CM

## 2023-02-18 NOTE — Progress Notes (Signed)
Established patient visit   Patient: Andrea Stephens   DOB: Jul 10, 1985   37 y.o. Female  MRN: [id number] Visit Date: 02/18/2023  T  HPI  Pt presents to have paperwork filled out for [facility]. Paperwork said a certified substance abuse counselor is needed to fill out this form. List was provided to patient. I told patient I am not a substance abuse counselor and cannot fill the form out.   No charge for today  Charlton Amor, DO  [facility] at [facility] [phone] (phone) [phone] (fax)  [facility]

## 2023-03-15 ENCOUNTER — Other Ambulatory Visit: Payer: Self-pay | Admitting: Family Medicine

## 2023-03-15 DIAGNOSIS — F319 Bipolar disorder, unspecified: Secondary | ICD-10-CM

## 2023-05-13 ENCOUNTER — Inpatient Hospital Stay (HOSPITAL_COMMUNITY)
Admission: RE | Admit: 2023-05-13 | Discharge: 2023-05-13 | Disposition: A | Discharge status: Home / Self Care | Payer: Self-pay | Source: Ambulatory Visit

## 2023-05-13 ENCOUNTER — Encounter (HOSPITAL_COMMUNITY): Payer: Self-pay | Admitting: Emergency Medicine

## 2023-05-13 ENCOUNTER — Other Ambulatory Visit: Payer: Self-pay

## 2023-05-13 ENCOUNTER — Ambulatory Visit (HOSPITAL_COMMUNITY)
Admission: EM | Admit: 2023-05-13 | Discharge: 2023-05-13 | Disposition: A | Discharge status: Home / Self Care | Payer: BC Managed Care – PPO | Attending: Family Medicine | Admitting: Family Medicine

## 2023-05-13 DIAGNOSIS — R599 Enlarged lymph nodes, unspecified: Secondary | ICD-10-CM | POA: Diagnosis not present

## 2023-05-13 DIAGNOSIS — K029 Dental caries, unspecified: Secondary | ICD-10-CM | POA: Diagnosis not present

## 2023-05-13 DIAGNOSIS — L0591 Pilonidal cyst without abscess: Secondary | ICD-10-CM | POA: Diagnosis not present

## 2023-05-13 DIAGNOSIS — K047 Periapical abscess without sinus: Secondary | ICD-10-CM | POA: Diagnosis not present

## 2023-05-13 MED ORDER — AMOXICILLIN-POT CLAVULANATE 875-125 MG PO TABS: 1.0000 | ORAL_TABLET | Freq: Two times a day (BID) | ORAL | 0 refills | Status: AC

## 2023-05-13 NOTE — ED Provider Notes (Signed)
[facility]    CSN: [id number] Arrival date & time: 05/13/23  1500      History   Chief Complaint Chief Complaint  Patient presents with   Dental Pain    HPI Andrea Stephens is a 37 y.o. female.    Dental Pain  Patient is here for dental pain that started about 1 week ago.  Located at the right lower tooth/molars.  No fevers/chills.  She does not have a dentist.  Some nausea, diarrhea since the tooth infection.   She has a boil at the lower back/buttocks.  Has been there for about month or longer.  It is painful, but just won't go away.  It will enlarge, and then drain on its own, but come back again.   She also has tender lymph node at the right neck, and the left groin.  The left groin LN has been there x several weeks.         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 No pertinent surgical history.  OB History   No obstetric history on file.      Home Medications    Prior to Admission medications   Medication Sig Start Date End Date Taking? Authorizing Provider  FLUoxetine (PROZAC) 20 MG tablet Take 1 tablet (20 mg total) by mouth daily. NEEDS APT FOR FURTHER REFILLS 03/16/23  Yes Morey Hummingbird S, DO  Fluoxetine HCl, PMDD, 20 MG TABS Take by mouth.  04/20/22  Yes [provider]  lamoTRIgine (LAMICTAL) 150 MG tablet Take 1 tablet (150 mg total) by mouth daily. 04/20/22  Yes Tamera Punt, Erika S, DO  QUEtiapine (SEROQUEL) 100 MG tablet TAKE 1 TABLET BY MOUTH EVERYDAY AT BEDTIME 12/07/22  Yes Wachs, Erika S, DO  QUEtiapine (SEROQUEL) 100 MG tablet Take 1 tablet by mouth at bedtime. 12/07/22  Yes [provider]  chlorhexidine (PERIDEX) 0.12 % solution 15 mL swish and spit bid 06/07/22   Domenick Gong, MD  cyclobenzaprine (FLEXERIL) 10 MG tablet Take 1 tablet (10 mg total) by mouth 2 (two) times daily as needed for muscle spasms. Do not drink alcohol or drive while taking this medication.  May cause drowsiness. 10/20/21   Particia Nearing, PA-C  naproxen (NAPROSYN) 500 MG tablet Take 1 tablet (500 mg total) by mouth 2 (two) times daily. 06/07/22   Domenick Gong, MD    Family History Family History  Problem Relation Age of Onset   Cancer Mother    Depression Mother    Alcohol abuse Father    Diabetes Paternal Aunt    Diabetes Paternal Uncle     Social History Social History   Tobacco Use   Smoking status: Every Day    Current packs/day: 0.50    Types: Cigarettes   Smokeless  tobacco: Never  Substance Use Topics   Alcohol use: No   Drug use: Not Currently    Comment: clean x 2.5 yrs as of 06/12/2018     Allergies   Codeine   Review of Systems Review of Systems  Constitutional: Negative.   HENT:  Positive for dental problem.   Respiratory: Negative.    Cardiovascular: Negative.   Gastrointestinal:  Positive for diarrhea.  Skin:  Positive for wound.  Hematological:  Positive for adenopathy.     Physical Exam Triage Vital Signs ED Triage Vitals  Encounter Vitals Group     BP 05/13/23 1527 104/65     Systolic BP Percentile --      Diastolic BP Percentile --      Pulse Rate 05/13/23 1527 87     Resp 05/13/23 1527 18     Temp 05/13/23 1527 98 F (36.7 C)     Temp Source 05/13/23 1527 Oral      SpO2 05/13/23 1527 97 %     Weight --      Height --      Head Circumference --      Peak Flow --      Pain Score 05/13/23 1528 6     Pain Loc --      Pain Education --      Exclude from Growth Chart --    No data found.  Updated Vital Signs BP 104/65 (BP Location: Right Arm)   Pulse 87   Temp 98 F (36.7 C) (Oral)   Resp 18   LMP 05/03/2023 (Exact Date)   SpO2 97%   Visual Acuity Right Eye Distance:   Left Eye Distance:   Bilateral Distance:    Right Eye Near:   Left Eye Near:    Bilateral Near:     Physical Exam Constitutional:      Appearance: Normal appearance.  Neck:     Comments: Enlarged lymph node at right cervical chain Pulmonary:     Effort: Pulmonary effort is normal.     Breath sounds: Normal breath sounds.  Abdominal:     Palpations: Abdomen is soft.  Musculoskeletal:     Cervical back: Normal range of motion and neck supple. Tenderness present.  Lymphadenopathy:     Cervical: Cervical adenopathy present.     Comments: Large, tender LN at the left inguinal canal;  No tenderness at the right  Skin:    Comments: There is an area of fullness, tenderness just right of the superior gluteal cleft;  soft  Neurological:     General: No focal deficit present.     Mental Status: She is alert.  Psychiatric:        Mood and Affect: Mood normal.      UC Treatments / Results  Labs (all labs ordered are listed, but only abnormal results are displayed) Labs Reviewed - No data to display  EKG   Radiology No results found.  Procedures Procedures (including critical care time)  Medications Ordered in UC Medications - No data to display  Initial Impression / Assessment and Plan / UC Course  I have reviewed the triage vital signs and the nursing notes.  Pertinent labs & imaging results that were available during my care of the patient were reviewed by me and considered in my medical decision making (see chart for details).   Final Clinical  Impressions(s) / UC Diagnoses   Final diagnoses:  Dental caries  Infection of tooth  Pilonidal cyst  Enlarged lymph node     Discharge Instructions      You were seen today for various issues.  I have sent out one antibiotic to cover for both the tooth infection and the abscess at the buttocks.  Please use tylenol/motrin for pain and follow up with a dentist.  For the abscess, I recommend warm soaks.  You may need to follow up with a surgeon for resolution.  You may call [facility] at [phone].  If the enlarged lymph node at the groin does not resolve with the antibiotic, then please follow up with your doctor for further testing.     ED Prescriptions     Medication Sig Dispense Auth. Provider   amoxicillin-clavulanate (AUGMENTIN) 875-125 MG tablet Take 1 tablet by mouth every 12 (twelve) hours for 10 days. 20 tablet Jannifer Franklin, MD      PDMP not reviewed this encounter.   Jannifer Franklin, MD 05/14/23 [phone]

## 2023-05-13 NOTE — ED Triage Notes (Signed)
Pt reports tooth pain to R side of mouth with facial swelling x7 days. Pain with chewing and swallowing. Pain starting to radiate up to R ear x2 days. No relief with tylenol or ibuprofen. No fevers noted at home. Pt does not have a primary dentist and has not contacted any dental offices. Pt also reports diarrhea that started at the same time as dental pain.   Pt would also like provider to examine swollen lymph node to L inguinal area.

## 2023-05-13 NOTE — Discharge Instructions (Signed)
You were seen today for various issues.  I have sent out one antibiotic to cover for both the tooth infection and the abscess at the buttocks.  Please use tylenol/motrin for pain and follow up with a dentist.  For the abscess, I recommend warm soaks.  You may need to follow up with a surgeon for resolution.  You may call [facility] at [phone].  If the enlarged lymph node at the groin does not resolve with the antibiotic, then please follow up with your doctor for further testing.

## 2023-05-21 ENCOUNTER — Inpatient Hospital Stay: Payer: BC Managed Care – PPO | Admitting: Family Medicine

## 2023-06-14 DIAGNOSIS — F331 Major depressive disorder, recurrent, moderate: Secondary | ICD-10-CM | POA: Diagnosis not present

## 2023-06-15 ENCOUNTER — Encounter: Payer: Self-pay | Admitting: Family Medicine

## 2023-06-15 ENCOUNTER — Ambulatory Visit: Payer: BC Managed Care – PPO | Admitting: Family Medicine

## 2023-06-15 DIAGNOSIS — F319 Bipolar disorder, unspecified: Secondary | ICD-10-CM

## 2023-06-15 MED ORDER — QUETIAPINE FUMARATE 100 MG PO TABS: 100.0000 mg | ORAL_TABLET | Freq: Every evening | ORAL | 1 refills | Status: DC

## 2023-06-15 MED ORDER — LAMOTRIGINE 150 MG PO TABS: 150.0000 mg | ORAL_TABLET | Freq: Every day | ORAL | 2 refills | Status: DC

## 2023-06-15 MED ORDER — FLUOXETINE HCL 20 MG PO TABS: 20.0000 mg | ORAL_TABLET | Freq: Every day | ORAL | 0 refills | Status: DC

## 2023-06-15 NOTE — Assessment & Plan Note (Addendum)
 PHQ 9 and GAD7 repeated and are 0 and 1 - have gone ahead and refilled prozac 20mg , lamictal, and seroquel 100mg   - pt denies any homicidal or suicidal ideation  - have sent referral to psychiatry

## 2023-06-15 NOTE — Progress Notes (Signed)
 Established patient visit   Patient: Andrea Stephens   DOB: September 09, 1985   38 y.o. Female  MRN: [id number] Visit Date: 06/15/2023  Today's healthcare provider: Bernice GORMAN [place], DO   Chief Complaint  Patient presents with   Medication Refill    Pt comes in to have all of her medications refilled    SUBJECTIVE    Chief Complaint  Patient presents with   Medication Refill    Pt comes in to have all of her medications refilled   HPI HPI     Medication Refill    Additional comments: Pt comes in to have all of her medications refilled      Last edited by Duwaine Annabella, CMA on 06/15/2023  2:08 PM.      Pt presents for medication refills for bipolar. She is currently not seeing a psychiatrist but is open to one.    Review of Systems  Constitutional:  Negative for activity change, fatigue and fever.  Respiratory:  Negative for cough and shortness of breath.   Cardiovascular:  Negative for chest pain.  Gastrointestinal:  Negative for abdominal pain.  Genitourinary:  Negative for difficulty urinating.       No outpatient medications have been marked as taking for the 06/15/23 encounter (Office Visit) with [place] Bernice GORMAN, DO.    OBJECTIVE    BP 112/74 (BP Location: Left Arm, Patient Position: Sitting, Cuff Size: Normal)   Pulse 98   Ht 6' 1 (1.854 m)   Wt 143 lb 4 oz (65 kg)   SpO2 100%   BMI 18.90 kg/m   Physical Exam Vitals and nursing note reviewed.  Constitutional:      General: She is not in acute distress.    Appearance: Normal appearance.  HENT:     Head: Normocephalic and atraumatic.     Right Ear: External ear normal.     Left Ear: External ear normal.     Nose: Nose normal.  Eyes:     Conjunctiva/sclera: Conjunctivae normal.  Cardiovascular:     Rate and Rhythm: Normal rate.  Pulmonary:     Effort: Pulmonary effort is normal.  Neurological:     General: No focal deficit present.     Mental Status: She is alert and oriented to person, place,  and time.  Psychiatric:        Mood and Affect: Mood normal.        Behavior: Behavior normal.        Thought Content: Thought content normal.        Judgment: Judgment normal.        ASSESSMENT & PLAN    Problem List Items Addressed This Visit       Other   Bipolar I disorder (HCC)   PHQ 9 and GAD7 repeated and are 0 and 1 - have gone ahead and refilled prozac  20mg , lamictal , and seroquel  100mg   - pt denies any homicidal or suicidal ideation  - have sent referral to psychiatry       Relevant Medications   QUEtiapine  (SEROQUEL ) 100 MG tablet   FLUoxetine  (PROZAC ) 20 MG tablet   lamoTRIgine  (LAMICTAL ) 150 MG tablet   Other Relevant Orders   Ambulatory referral to Psychiatry    Return in about 3 months (around 09/13/2023).      Meds ordered this encounter  Medications   QUEtiapine  (SEROQUEL ) 100 MG tablet    Sig: Take 1 tablet (100 mg total) by mouth at bedtime.  Dispense:  90 tablet    Refill:  1   FLUoxetine  (PROZAC ) 20 MG tablet    Sig: Take 1 tablet (20 mg total) by mouth daily. NEEDS APT FOR FURTHER REFILLS    Dispense:  30 tablet    Refill:  0   lamoTRIgine  (LAMICTAL ) 150 MG tablet    Sig: Take 1 tablet (150 mg total) by mouth daily.    Dispense:  30 tablet    Refill:  2    Orders Placed This Encounter  Procedures   Ambulatory referral to Psychiatry    Referral Priority:   Routine    Referral Type:   Psychiatric    Referral Reason:   Specialty Services Required    Requested Specialty:   Psychiatry    Number of Visits Requested:   1     Bernice GORMAN [place], DO  [facility] at [facility] [phone] (phone) [phone] (fax)  [facility]

## 2023-07-05 DIAGNOSIS — F331 Major depressive disorder, recurrent, moderate: Secondary | ICD-10-CM | POA: Diagnosis not present

## 2023-07-12 DIAGNOSIS — F331 Major depressive disorder, recurrent, moderate: Secondary | ICD-10-CM | POA: Diagnosis not present

## 2023-07-26 DIAGNOSIS — F331 Major depressive disorder, recurrent, moderate: Secondary | ICD-10-CM | POA: Diagnosis not present

## 2023-08-10 ENCOUNTER — Encounter: Admitting: Family Medicine

## 2023-08-12 DIAGNOSIS — F4323 Adjustment disorder with mixed anxiety and depressed mood: Secondary | ICD-10-CM | POA: Diagnosis not present

## 2023-08-19 DIAGNOSIS — F4323 Adjustment disorder with mixed anxiety and depressed mood: Secondary | ICD-10-CM | POA: Diagnosis not present

## 2023-08-23 DIAGNOSIS — F331 Major depressive disorder, recurrent, moderate: Secondary | ICD-10-CM | POA: Diagnosis not present

## 2023-08-27 ENCOUNTER — Encounter: Payer: Self-pay | Admitting: Medical-Surgical

## 2023-08-27 ENCOUNTER — Ambulatory Visit: Admitting: Medical-Surgical

## 2023-08-27 VITALS — BP 103/67 | HR 94 | Resp 20 | Ht 73.0 in | Wt 147.8 lb

## 2023-08-27 DIAGNOSIS — L0591 Pilonidal cyst without abscess: Secondary | ICD-10-CM | POA: Diagnosis not present

## 2023-08-27 DIAGNOSIS — F319 Bipolar disorder, unspecified: Secondary | ICD-10-CM | POA: Diagnosis not present

## 2023-08-27 DIAGNOSIS — F4323 Adjustment disorder with mixed anxiety and depressed mood: Secondary | ICD-10-CM | POA: Diagnosis not present

## 2023-08-27 DIAGNOSIS — Z0289 Encounter for other administrative examinations: Secondary | ICD-10-CM

## 2023-08-27 MED ORDER — FLUOXETINE HCL 20 MG PO TABS: 20.0000 mg | ORAL_TABLET | Freq: Every day | ORAL | 1 refills | Status: AC

## 2023-08-27 MED ORDER — LAMOTRIGINE 150 MG PO TABS: 150.0000 mg | ORAL_TABLET | Freq: Every day | ORAL | 1 refills | Status: AC

## 2023-08-27 NOTE — Progress Notes (Signed)
        Established patient visit  History, exam, impression, and plan:  1. Encounter for completion of form with patient (Primary) Very pleasant 38 year old female presenting today for completion of DMV paperwork.  She has a long history of substance abuse including marijuana, cocaine, ecstasy, alcohol, and opioids.  Notes last date of substance use was 12/07/2015.  Has been clean since then and denies any further drug use outside of what is prescribed.  Has forms for the [facility] that were supposed to be completed before but did not get this done.  Has already seen her ophthalmologist to complete the form for the vision exam.  Today, needs her medical history and substance abuse forms completed.  Reviewed information required and ask any questions for accurate completion.  Forms were completed and copied to place in her chart.  Originals were provided back to patient.  2. Bipolar I disorder (HCC) Currently seeing Dr. Tamera Punt as a primary care provider but is requesting refills on her mental health medications.  Taking fluoxetine 20 mg daily along with Lamictal 150 mg daily.  Tolerating well without side effects.  Feels the medications are doing very well and keeping her mood stable.  Denies any current concerns or desire for dose changes.  Denies SI/HI.  Mood, affect, speech, and cognition normal during appointment.  Continue fluoxetine and Lamictal as prescribed. - FLUoxetine (PROZAC) 20 MG tablet; Take 1 tablet (20 mg total) by mouth daily.  Dispense: 90 tablet; Refill: 1 - lamoTRIgine (LAMICTAL) 150 MG tablet; Take 1 tablet (150 mg total) by mouth daily.  Dispense: 90 tablet; Refill: 1  3. Pilonidal cyst She does have a lesion at the top of the natal cleft to the right that appears to be a pilonidal cyst that has ruptured and is healing with granulation tissue outside of the wound parameters.  The edges are clean with a pink central area protruding approximately 3-4 mm.  The wound is oval-shaped with  slight irregularity of the borders.  No surrounding erythema, fluctuance, or drainage.  Completed antibiotics however the cyst did not fully heal.  Unfortunately, suspect there is a sinus tract causing recurrence.  Recommend keeping it covered and monitoring for purulent drainage.  Referring to general surgery to discuss possible surgical intervention for complete removal to prevent recurrence. - Ambulatory referral to General Surgery   Procedures performed this visit: None.  Return for Follow-up as directed by PCP.  __________________________________ Thayer Ohm, DNP, APRN, FNP-BC Primary Care and Sports Medicine [facility]

## 2023-09-06 DIAGNOSIS — F331 Major depressive disorder, recurrent, moderate: Secondary | ICD-10-CM | POA: Diagnosis not present

## 2023-09-20 DIAGNOSIS — F331 Major depressive disorder, recurrent, moderate: Secondary | ICD-10-CM | POA: Diagnosis not present

## 2023-09-21 DIAGNOSIS — L0591 Pilonidal cyst without abscess: Secondary | ICD-10-CM | POA: Diagnosis not present

## 2023-09-29 ENCOUNTER — Other Ambulatory Visit: Payer: Self-pay | Admitting: Family Medicine

## 2023-09-29 DIAGNOSIS — F319 Bipolar disorder, unspecified: Secondary | ICD-10-CM

## 2023-10-04 DIAGNOSIS — F331 Major depressive disorder, recurrent, moderate: Secondary | ICD-10-CM | POA: Diagnosis not present

## 2023-10-05 DIAGNOSIS — F4323 Adjustment disorder with mixed anxiety and depressed mood: Secondary | ICD-10-CM | POA: Diagnosis not present

## 2023-10-07 ENCOUNTER — Encounter: Payer: Self-pay | Admitting: Family Medicine

## 2023-10-18 DIAGNOSIS — F331 Major depressive disorder, recurrent, moderate: Secondary | ICD-10-CM | POA: Diagnosis not present

## 2023-11-11 DIAGNOSIS — F4323 Adjustment disorder with mixed anxiety and depressed mood: Secondary | ICD-10-CM | POA: Diagnosis not present

## 2023-11-15 DIAGNOSIS — F331 Major depressive disorder, recurrent, moderate: Secondary | ICD-10-CM | POA: Diagnosis not present

## 2023-11-18 DIAGNOSIS — F4323 Adjustment disorder with mixed anxiety and depressed mood: Secondary | ICD-10-CM | POA: Diagnosis not present

## 2024-02-01 ENCOUNTER — Encounter: Payer: Self-pay | Admitting: Sports Medicine

## 2024-02-08 ENCOUNTER — Encounter: Admitting: Urgent Care

## 2024-02-24 ENCOUNTER — Encounter: Admitting: Urgent Care

## 2024-06-21 ENCOUNTER — Other Ambulatory Visit: Payer: Self-pay | Admitting: Family Medicine

## 2024-06-21 DIAGNOSIS — F319 Bipolar disorder, unspecified: Secondary | ICD-10-CM

## 2024-06-28 ENCOUNTER — Telehealth: Payer: Self-pay

## 2024-06-28 ENCOUNTER — Other Ambulatory Visit: Payer: Self-pay

## 2024-06-28 DIAGNOSIS — F319 Bipolar disorder, unspecified: Secondary | ICD-10-CM

## 2024-06-28 NOTE — Telephone Encounter (Signed)
 Patient is scheduled for 09/08/2024 for TOC to [facility]

## 2024-06-28 NOTE — Telephone Encounter (Signed)
 Requesting rx rf of Seroquel  100mg  until TOC appt scheduled for 09/08/2024 with Benton Gave Last written 05/06/025 dr. alvan Last OV 08/27/2023 joy jessup [facility] appt = 4/10/226

## 2024-06-28 NOTE — Telephone Encounter (Signed)
 Copied from CRM #[id number]. Topic: Appointments - Transfer of Care >> Jun 28, 2024  3:59 PM [name] wrote: Pt is requesting to transfer FROM: Andrea Stephens Pt is requesting to transfer TO: Andrea Stephens Gave Reason for requested transfer: Provider Left It is the responsibility of the team the patient would like to transfer to [facility]) to reach out to the patient if for any reason this transfer is not acceptable.

## 2024-06-28 NOTE — Telephone Encounter (Signed)
 Copied from CRM #[id number]. Topic: Clinical - Prescription Issue >> Jun 28, 2024  4:00 PM [name] wrote: Reason for CRM: Patient requested a refill of QUEtiapine  (SEROQUEL ) 100 MG tablet, Was declined based on needing appointment. Scheduled a TOC but unable to be seen until April. Is requesting to see if she would be able to get enough refills to last until her appointment.   Patient can be reached at [phone]

## 2024-06-29 MED ORDER — QUETIAPINE FUMARATE 100 MG PO TABS: 100.0000 mg | ORAL_TABLET | Freq: Every day | ORAL | 0 refills | Status: AC

## 2024-06-29 NOTE — Telephone Encounter (Signed)
 Can we move her appointment sooner? It will have been well over a year since she has been here if I refill now. She can be scheduled in an office visit slot rather than a TOC or new pt slot thanks

## 2024-06-29 NOTE — Telephone Encounter (Signed)
 LM for patient to see if we can get her in sooner for medication refills.

## 2024-06-30 ENCOUNTER — Encounter: Admitting: Urgent Care

## 2024-09-08 ENCOUNTER — Encounter: Admitting: Urgent Care
